# Patient Record
Sex: Female | Born: 1975 | Race: Black or African American | Hispanic: No | Marital: Married | Smoking: Never smoker
Health system: Southern US, Community
[De-identification: ages and names within clinical notes are randomized; demographics above are authoritative.]

## PROBLEM LIST (undated history)

## (undated) DIAGNOSIS — I10 Essential (primary) hypertension: Secondary | ICD-10-CM

## (undated) DIAGNOSIS — N920 Excessive and frequent menstruation with regular cycle: Secondary | ICD-10-CM

## (undated) DIAGNOSIS — R0602 Shortness of breath: Secondary | ICD-10-CM

## (undated) DIAGNOSIS — I499 Cardiac arrhythmia, unspecified: Secondary | ICD-10-CM

## (undated) DIAGNOSIS — D649 Anemia, unspecified: Secondary | ICD-10-CM

## (undated) DIAGNOSIS — R011 Cardiac murmur, unspecified: Secondary | ICD-10-CM

## (undated) DIAGNOSIS — D219 Benign neoplasm of connective and other soft tissue, unspecified: Secondary | ICD-10-CM

## (undated) HISTORY — DX: Anemia, unspecified: D64.9

## (undated) HISTORY — PX: ABDOMINAL HYSTERECTOMY: SHX81

## (undated) HISTORY — DX: Shortness of breath: R06.02

## (undated) HISTORY — DX: Cardiac murmur, unspecified: R01.1

## (undated) HISTORY — DX: Cardiac arrhythmia, unspecified: I49.9

## (undated) HISTORY — DX: Essential (primary) hypertension: I10

---

## 1999-02-10 ENCOUNTER — Inpatient Hospital Stay (HOSPITAL_COMMUNITY): Admission: AD | Admit: 1999-02-10 | Discharge: 1999-02-13 | Payer: Self-pay | Admitting: Obstetrics

## 2000-09-24 ENCOUNTER — Other Ambulatory Visit: Admission: RE | Admit: 2000-09-24 | Discharge: 2000-09-24 | Payer: Self-pay | Admitting: *Deleted

## 2000-10-10 ENCOUNTER — Inpatient Hospital Stay (HOSPITAL_COMMUNITY): Admission: AD | Admit: 2000-10-10 | Discharge: 2000-10-12 | Payer: Self-pay | Admitting: *Deleted

## 2000-12-22 ENCOUNTER — Other Ambulatory Visit: Admission: RE | Admit: 2000-12-22 | Discharge: 2000-12-22 | Payer: Self-pay | Admitting: *Deleted

## 2001-05-28 ENCOUNTER — Other Ambulatory Visit: Admission: RE | Admit: 2001-05-28 | Discharge: 2001-05-28 | Payer: Self-pay | Admitting: Obstetrics & Gynecology

## 2001-11-24 ENCOUNTER — Other Ambulatory Visit: Admission: RE | Admit: 2001-11-24 | Discharge: 2001-11-24 | Payer: Self-pay | Admitting: Obstetrics & Gynecology

## 2002-07-05 ENCOUNTER — Other Ambulatory Visit: Admission: RE | Admit: 2002-07-05 | Discharge: 2002-07-05 | Payer: Self-pay | Admitting: Obstetrics & Gynecology

## 2003-01-06 ENCOUNTER — Other Ambulatory Visit: Admission: RE | Admit: 2003-01-06 | Discharge: 2003-01-06 | Payer: Self-pay | Admitting: Obstetrics & Gynecology

## 2003-03-12 HISTORY — PX: TUBAL LIGATION: SHX77

## 2003-04-17 ENCOUNTER — Emergency Department (HOSPITAL_COMMUNITY): Admission: EM | Admit: 2003-04-17 | Discharge: 2003-04-18 | Payer: Self-pay | Admitting: Emergency Medicine

## 2003-07-01 ENCOUNTER — Ambulatory Visit (HOSPITAL_COMMUNITY): Admission: RE | Admit: 2003-07-01 | Discharge: 2003-07-01 | Payer: Self-pay | Admitting: Obstetrics & Gynecology

## 2010-05-08 ENCOUNTER — Encounter (INDEPENDENT_AMBULATORY_CARE_PROVIDER_SITE_OTHER): Payer: Self-pay | Admitting: *Deleted

## 2010-05-17 NOTE — Letter (Signed)
Summary: New Patient letter  [facility]  [address]   [place], [place] [address]   Phone: [phone]  Fax: [phone]       05/08/2010 MRN: [id number]  [facility] [address] [place]  [address]  Dear Robin Townsend,  Welcome to the Gastroenterology Division at [facility].    You are scheduled to see Dr.  Russella Dar on 06/25/2010 at 3:30 on the 3rd floor at [facility], [address]  We ask that you try to arrive at our office 15 minutes prior to your appointment time to allow for check-in.  We would like you to complete the enclosed self-administered evaluation form prior to your visit and bring it with you on the day of your appointment.  We will review it with you.  Also, please bring a complete list of all your medications or, if you prefer, bring the medication bottles and we will list them.  Please bring your insurance card so that we may make a copy of it.  If your insurance requires a referral to see a specialist, please bring your referral form from your primary care physician.  Co-payments are due at the time of your visit and may be paid by cash, check or credit card.     Your office visit will consist of a consult with your physician (includes a physical exam), any laboratory testing he/she may order, scheduling of any necessary diagnostic testing (e.g. x-ray, ultrasound, CT-scan), and scheduling of a procedure (e.g. Endoscopy, Colonoscopy) if required.  Please allow enough time on your schedule to allow for any/all of these possibilities.    If you cannot keep your appointment, please call [phone] to cancel or reschedule prior to your appointment date.  This allows [place] the opportunity to schedule an appointment for another patient in need of care.  If you do not cancel or reschedule by 5 p.m. the business day prior to your appointment date, you will be charged a $50.00 late cancellation/no-show fee.    Thank you for  choosing [facility] for your medical needs.  We appreciate the opportunity to care for you.  Please visit [place] at our website  to learn more about our practice.                     Sincerely,                                                             The Gastroenterology Division

## 2010-06-25 ENCOUNTER — Ambulatory Visit: Payer: Self-pay | Admitting: Gastroenterology

## 2010-08-08 ENCOUNTER — Ambulatory Visit (INDEPENDENT_AMBULATORY_CARE_PROVIDER_SITE_OTHER): Payer: Managed Care, Other (non HMO) | Admitting: Gastroenterology

## 2010-08-08 ENCOUNTER — Encounter: Payer: Self-pay | Admitting: Gastroenterology

## 2010-08-08 VITALS — BP 108/70 | HR 80 | Ht 64.5 in | Wt 182.6 lb

## 2010-08-08 DIAGNOSIS — R110 Nausea: Secondary | ICD-10-CM

## 2010-08-08 MED ORDER — OMEPRAZOLE 20 MG PO CPDR: 20.0000 mg | DELAYED_RELEASE_CAPSULE | Freq: Every day | ORAL | Status: DC

## 2010-08-08 NOTE — Progress Notes (Signed)
History of Present Illness: This is a  35 year old female who relates a several month history of postprandial nausea. She states with almost any meal she has 5-15 minutes and nausea following the meal and then her symptoms abate. She denies vomiting, heartburn, chest pain, abdominal pain, weight loss, melena, hematochezia, dysphagia. She relates a history of iron deficiency anemia felt secondary to menstrual losses followed by her gynecologist.  Review of Systems: Pertinent positive and negative review of systems were noted in the above HPI section. All other review of systems were otherwise negative.  Current Medications, Allergies, Past Medical History, Past Surgical History, Family History and Social History were reviewed in [employer] record.  Physical Exam: General: Well developed , well nourished, no acute distress Head: Normocephalic and atraumatic Eyes:  sclerae anicteric, EOMI Ears: Normal auditory acuity Mouth: No deformity or lesions Neck: Supple, no masses or thyromegaly Lungs: Clear throughout to auscultation Heart: Regular rate and rhythm; no murmurs, rubs or bruits Abdomen: Soft, non tender and non distended. No masses, hepatosplenomegaly or hernias noted. Normal Bowel sounds Musculoskeletal: Symmetrical with no gross deformities  Skin: No lesions on visible extremities Pulses:  Normal pulses noted Extremities: No clubbing, cyanosis, edema or deformities noted Neurological: Alert oriented x 4, grossly nonfocal Cervical Nodes:  No significant cervical adenopathy Inguinal Nodes: No significant inguinal adenopathy Psychological:  Alert and cooperative. Normal mood and affect  Assessment and Recommendations:  1. Nausea-probably secondary to GERD. Begin standard antireflux measures and omeprazole 20 mg every morning. Return office visit in 4 weeks. If her symptoms have not resolved consider further evaluation with upper endoscopy.

## 2010-08-08 NOTE — Patient Instructions (Signed)
Your prescription has been sent to your pharmacy.  Patient advised to avoid spicy, acidic, citrus, chocolate, mints, fruit and fruit juices.  Limit the intake of caffeine, alcohol and Soda.  Don't exercise too soon after eating.  Don't lie down within 3-4 hours of eating.  Elevate the head of your bed.

## 2010-09-05 ENCOUNTER — Ambulatory Visit: Payer: Managed Care, Other (non HMO) | Admitting: Gastroenterology

## 2011-04-16 ENCOUNTER — Ambulatory Visit (INDEPENDENT_AMBULATORY_CARE_PROVIDER_SITE_OTHER): Payer: Managed Care, Other (non HMO) | Admitting: Family Medicine

## 2011-04-16 ENCOUNTER — Ambulatory Visit: Payer: Managed Care, Other (non HMO) | Admitting: Physician Assistant

## 2011-04-16 VITALS — BP 123/79 | HR 88 | Temp 98.6°F | Resp 16 | Ht 65.5 in | Wt 187.0 lb

## 2011-04-16 DIAGNOSIS — M79672 Pain in left foot: Secondary | ICD-10-CM

## 2011-04-16 DIAGNOSIS — M79609 Pain in unspecified limb: Secondary | ICD-10-CM

## 2011-04-16 MED ORDER — TRAMADOL HCL 50 MG PO TABS: 50.0000 mg | ORAL_TABLET | Freq: Three times a day (TID) | ORAL | Status: AC | PRN

## 2011-04-16 NOTE — Progress Notes (Signed)
  Subjective:    Patient ID: Robin Townsend, female    DOB: 1975/05/05, 36 y.o.   MRN: [id number]  HPI Patient presents with a 3 week history of (R) heel pain after playing basketball. Pain is intermitant and worse upon awakening and after being on her feet for prolong period of time. Wears shoes with poor support.  Sunday, son accidentally fell on right foot. (L) 2nd-4th toes ?bent backward. Painful to bear weight.  (L) middle toe most painful.    Review of Systems  Musculoskeletal: Positive for joint swelling. Gait problem: painful to bear weight.       Objective:   Physical Exam  Constitutional: She appears well-developed and well-nourished.  Cardiovascular: Normal rate, regular rhythm and normal heart sounds.   Pulmonary/Chest: Effort normal and breath sounds normal.  Musculoskeletal:       Left foot: She exhibits tenderness (pain over left 2nd, 3rd, 4th distal phalanx, swelling), bony tenderness and swelling. She exhibits no deformity.       Feet:      [facility] reading (PRIMARY) by  Dr. Hal Hope No fracture or dislocation.     Assessment & Plan:  Traumatic injury to (L) 2nd-4th toes; ligamentous Plantar fasciitis-(L) heel   P/ Ultram 50mg   8hours prn      Post op shoe      Call or f/u in 10 days INB

## 2011-04-17 ENCOUNTER — Telehealth: Payer: Self-pay

## 2011-04-17 NOTE — Telephone Encounter (Signed)
Called pt back, who had LM on nurse VM, and explained Dr Richter's notes on xray report. Pt agreed.

## 2011-04-18 ENCOUNTER — Telehealth: Payer: Self-pay | Admitting: Family Medicine

## 2011-04-18 NOTE — Telephone Encounter (Signed)
LMOM FOR PT TO CALL BACK WITH STATUS.

## 2011-04-18 NOTE — Progress Notes (Signed)
  Subjective:    Patient ID: Robin Townsend, female    DOB: Aug 17, 1975, 36 y.o.   MRN: [id number]  HPI    Review of Systems     Objective:   Physical Exam        Assessment & Plan:

## 2011-04-21 NOTE — Telephone Encounter (Signed)
PT STATES SHE IS A LITTLE BETTER AND SHE IS STILL USING THE ICE PACKS BUT IS BETTER THAN BEFORE

## 2011-10-18 ENCOUNTER — Encounter: Payer: Self-pay | Admitting: *Deleted

## 2011-10-19 NOTE — Progress Notes (Signed)
This encounter was created in error - please disregard.

## 2011-11-15 NOTE — Progress Notes (Signed)
This encounter was created in error - please disregard.

## 2015-08-24 DIAGNOSIS — N760 Acute vaginitis: Secondary | ICD-10-CM | POA: Diagnosis not present

## 2015-08-24 DIAGNOSIS — Z113 Encounter for screening for infections with a predominantly sexual mode of transmission: Secondary | ICD-10-CM | POA: Diagnosis not present

## 2015-11-23 DIAGNOSIS — Z01419 Encounter for gynecological examination (general) (routine) without abnormal findings: Secondary | ICD-10-CM | POA: Diagnosis not present

## 2015-11-23 DIAGNOSIS — N920 Excessive and frequent menstruation with regular cycle: Secondary | ICD-10-CM | POA: Diagnosis not present

## 2015-11-23 DIAGNOSIS — Z6831 Body mass index (BMI) 31.0-31.9, adult: Secondary | ICD-10-CM | POA: Diagnosis not present

## 2015-11-23 DIAGNOSIS — D649 Anemia, unspecified: Secondary | ICD-10-CM | POA: Diagnosis not present

## 2016-02-06 ENCOUNTER — Emergency Department (HOSPITAL_BASED_OUTPATIENT_CLINIC_OR_DEPARTMENT_OTHER)
Admission: EM | Admit: 2016-02-06 | Discharge: 2016-02-06 | Disposition: A | Discharge status: Home / Self Care | Payer: BLUE CROSS/BLUE SHIELD | Attending: Emergency Medicine | Admitting: Emergency Medicine

## 2016-02-06 ENCOUNTER — Encounter (HOSPITAL_BASED_OUTPATIENT_CLINIC_OR_DEPARTMENT_OTHER): Payer: Self-pay

## 2016-02-06 ENCOUNTER — Emergency Department (HOSPITAL_BASED_OUTPATIENT_CLINIC_OR_DEPARTMENT_OTHER): Payer: BLUE CROSS/BLUE SHIELD

## 2016-02-06 DIAGNOSIS — M545 Low back pain, unspecified: Secondary | ICD-10-CM

## 2016-02-06 DIAGNOSIS — R202 Paresthesia of skin: Secondary | ICD-10-CM | POA: Diagnosis not present

## 2016-02-06 LAB — URINALYSIS, ROUTINE W REFLEX MICROSCOPIC
Bilirubin Urine: NEGATIVE
GLUCOSE, UA: NEGATIVE mg/dL
Hgb urine dipstick: NEGATIVE
KETONES UR: NEGATIVE mg/dL
LEUKOCYTES UA: NEGATIVE
NITRITE: NEGATIVE
PH: 5.5 (ref 5.0–8.0)
Protein, ur: NEGATIVE mg/dL
SPECIFIC GRAVITY, URINE: 1.024 (ref 1.005–1.030)

## 2016-02-06 LAB — PREGNANCY, URINE: Preg Test, Ur: NEGATIVE

## 2016-02-06 MED ORDER — CYCLOBENZAPRINE HCL 5 MG PO TABS
5.0000 mg | ORAL_TABLET | Freq: Once | ORAL | Status: DC
  Filled 2016-02-06: qty 1

## 2016-02-06 MED ORDER — ACETAMINOPHEN 325 MG PO TABS
650.0000 mg | ORAL_TABLET | Freq: Once | ORAL | Status: AC
  Administered 2016-02-06: 650 mg via ORAL
  Filled 2016-02-06: qty 2

## 2016-02-06 MED ORDER — CYCLOBENZAPRINE HCL 5 MG PO TABS: 5.0000 mg | ORAL_TABLET | Freq: Three times a day (TID) | ORAL | 0 refills | Status: DC | PRN

## 2016-02-06 NOTE — ED Triage Notes (Signed)
C/o lower back pain x 1.5 months-denies injury-NAD-steady gait

## 2016-02-06 NOTE — ED Provider Notes (Signed)
[facility] Provider Note   CSN: [id number] Arrival date & time: 02/06/16  1639  By signing my name below, I, [name], attest that this documentation has been prepared under the direction and in the presence of Gay Filler, PA-C Electronically Signed: [name], ED Scribe. 02/06/16. 6:14 PM.   History   Chief Complaint Chief Complaint  Patient presents with  . Back Pain    HPI Robin Townsend is a 40 y.o. female who presents to the Emergency Department complaining of constant, sharp lower back pain onset 1.5 months. Pt denies any recent injury, fall, or trauma to her lower back. Pt states that prior to the onset of her symptoms, she was ambulating a lot. She reports that the back pain does radiate to her left lower back and intermittently to her left leg. Pt states that her lower back pain is worsened with movement and ambulation. Pt denies alleviating factors for her lower back pain. She states that she is having associated symptoms of tingling to left leg. She states that she has tried ibuprofen with no relief for her symptoms. Pt denies fever, weight loss, night sweats, saddle paresthesia, bowel/bladder incontinence, gait problem, numbness, weakness, dysuria, hematuria, nausea, vomiting, blurred vision, double vision, and any other symptoms. Denies hx of CA, back pain, back injections, or IV drug use. Pt denies having a PCP at this time.     The history is provided by the patient. No language interpreter was used.    Past Medical History:  Diagnosis Date  . Anemia   . Irregular heart beat     There are no active problems to display for this patient.   Past Surgical History:  Procedure Laterality Date  . TUBAL LIGATION  2005    OB History    No data available       Home Medications    Prior to Admission medications   Medication Sig Start Date End Date Taking? Authorizing Provider  cyclobenzaprine (FLEXERIL) 5 MG tablet Take 1 tablet (5  mg total) by mouth 3 (three) times daily as needed for muscle spasms. 02/06/16   Roxanna Mew, PA-C    Family History Family History  Problem Relation Age of Onset  . Colon cancer Neg Hx     Social History Social History  Substance Use Topics  . Smoking status: Never Smoker  . Smokeless tobacco: Never Used  . Alcohol use Yes     Comment: occ     Allergies   Naproxen sodium   Review of Systems Review of Systems  Constitutional: Negative for chills, diaphoresis and fever.  Eyes: Negative for visual disturbance.  Gastrointestinal: Negative for abdominal pain, nausea and vomiting.       No bowel incontinence  Genitourinary: Negative for difficulty urinating, dysuria and hematuria.       No bladder incontinence  Musculoskeletal: Positive for back pain. Negative for gait problem.  Skin: Negative for rash.  Allergic/Immunologic: Negative for immunocompromised state.  Neurological: Negative for dizziness, weakness, light-headedness and numbness.       Tingling to left leg     Physical Exam Updated Vital Signs BP 130/95 (BP Location: Left Arm)   Pulse 97   Temp 98.9 F (37.2 C) (Oral)   Resp 18   Ht 5\' 4"  (1.626 m)   Wt 187 lb (84.8 kg)   LMP 01/15/2016   SpO2 100%   BMI 32.10 kg/m   Physical Exam  Constitutional: She appears well-developed and well-nourished. No distress.  HENT:  Head: Normocephalic and atraumatic.  Mouth/Throat: Oropharynx is clear and moist.  Eyes: Conjunctivae and EOM are normal. Pupils are equal, round, and reactive to light. Right eye exhibits no discharge. Left eye exhibits no discharge.  Neck: Normal range of motion. Neck supple.  Cardiovascular: Normal rate, regular rhythm, normal heart sounds and intact distal pulses.  Exam reveals no gallop and no friction rub.   No murmur heard. Pulmonary/Chest: Effort normal and breath sounds normal. No respiratory distress. She has no wheezes. She has no rales.  Abdominal: Soft. She exhibits  no distension and no pulsatile midline mass. There is no tenderness.  Musculoskeletal: Normal range of motion.       Lumbar back: She exhibits tenderness. She exhibits normal range of motion.  TTP along lumbar spine, no obvious deformity ors tep off. ROM intact. Pain reproducible with ROM. Negative SLR.   Lymphadenopathy:    She has no cervical adenopathy.  Neurological: She is alert. She has normal strength. She is not disoriented. No sensory deficit. She exhibits normal muscle tone. Coordination and gait normal. GCS eye subscore is 4. GCS verbal subscore is 5. GCS motor subscore is 6.  CN 2-12 grossly intact. Pt moves all extremities with ease. Strength and sensation intact. Pt ambulatory with steady gait.   Skin: Skin is warm and dry. She is not diaphoretic.  Psychiatric: She has a normal mood and affect. Her behavior is normal.  Nursing note and vitals reviewed.    ED Treatments / Results  DIAGNOSTIC STUDIES: Oxygen Saturation is 100% on RA, nl by my interpretation.    COORDINATION OF CARE: 6:12 PM Discussed treatment plan with pt at bedside which includes UA, lumbar spine xray and pt agreed to plan.   Labs (all labs ordered are listed, but only abnormal results are displayed) Labs Reviewed  URINALYSIS, [place] (NOT AT [facility])  PREGNANCY, URINE    Radiology Dg Lumbar Spine Complete  Result Date: 02/06/2016 CLINICAL DATA:  Low back pain x1 month.  No known injury. EXAM: LUMBAR SPINE - COMPLETE 4+ VIEW COMPARISON:  None. FINDINGS: The left twelfth rib is a short ribs for numbering purposes. Slight disc space narrowing is suggested at L4-5. No spondylolysis nor spondylolisthesis. No acute fracture nor bone destruction. Bilateral tubal ligation clips are seen. Sacroiliac joints an arcuate lines of the sacrum are intact. Moderate colonic stool burden is noted. IMPRESSION: Slight disc space narrowing at L4-5. Electronically Signed   By: Avya Flavell Royalty M.D.   On:  02/06/2016 18:53    Procedures Procedures (including critical care time)  Medications Ordered in ED Medications  acetaminophen (TYLENOL) tablet 650 mg (650 mg Oral Given 02/06/16 1919)     Initial Impression / Assessment and Plan / ED Course  I have reviewed the triage vital signs and the nursing notes.  Pertinent labs & imaging results that were available during my care of the patient were reviewed by me and considered in my medical decision making (see chart for details).  Clinical Course as of Feb 06 102  Tue Feb 06, 2016  1930 No obvious fracture or subluxation. Slight disc narrowing at L4-L5.   DG Lumbar Spine Complete [AM]    Clinical Course User Index [AM] Roxanna Mew, PA-C    Patient presents to ED with complaint of low back pain x 1.5 months, no known trauma, no dysuria, hematuria. Patient is afebrile and non-toxic appearing in NAD. VSS.  Mild TTP of lumbar spine without step off  or obvious deformity. ROM intact; however, pain reproducible with ROM. No neurological deficits.  Pt ambulatory. No loss of bowel or bladder control.  Low suspicion for cauda equina.  No fever, night sweats, weight loss, h/o cancer, IVDU.  U/A negative for infection. X-ray of lumbar spine shows no obvious fracture or dislocation, slight disc space narrowing at L4-L5. ?MSK give reproducibility of pain vs. ?disc space narrowing at L4-L5 as culprit of pain. Discussed results and plan with patient. RICE protocol and pain medicine indicated and discussed with patient. Ibuprofen and Rx flexeril. Follow up with spine specialists, contact information provided. Return precautions discussed. Pt voiced understanding and is agreeable with plan.     Final Clinical Impressions(s) / ED Diagnoses   Final diagnoses:  Acute left-sided low back pain without sciatica    New Prescriptions Discharge Medication List as of 02/06/2016  9:10 PM    START taking these medications   Details  cyclobenzaprine  (FLEXERIL) 5 MG tablet Take 1 tablet (5 mg total) by mouth 3 (three) times daily as needed for muscle spasms., Starting Tue 02/06/2016, Print       I personally performed the services described in this documentation, which was scribed in my presence. The recorded information has been reviewed and is accurate.     Roxanna Mew, PA-C 02/07/16 0103    Margette Fast, MD 02/07/16 1021

## 2016-02-06 NOTE — Discharge Instructions (Signed)
Read the information below.  Your urine did not show any signs of infection. You have slight disc space narrowing at the L4-L5. This may be contributing to your symptoms.  Please take 400mg  ibuprofen or 650mg  tylenol every 6hrs for the next 2-3 days. I have prescribed flexeril as a muscle relaxer, this can make you drowsy, do not drive after taking. You can apply heat or ice to affected area.  I have provided the contact information for the spine specialists. Please call to schedule a follow up appointment for further management of your back pain.  Use the prescribed medication as directed.  Please discuss all new medications with your pharmacist.   You may return to the Emergency Department at any time for worsening condition or any new symptoms that concern you. Return to ED if you develop fever, loss of bowel or bladder control, numbness or weakness into legs, or any other new/concerning symptoms.

## 2016-02-06 NOTE — ED Notes (Signed)
ED Provider at bedside. 

## 2016-02-06 NOTE — ED Notes (Signed)
Pt teaching provided on medications that may cause drowsiness. Pt instructed not to drive or operate heavy machinery while taking the prescribed medication. Pt verbalized understanding.   

## 2016-02-06 NOTE — ED Notes (Signed)
Patient transported to X-ray 

## 2016-06-12 ENCOUNTER — Encounter: Payer: Self-pay | Admitting: Nurse Practitioner

## 2016-06-12 ENCOUNTER — Ambulatory Visit (INDEPENDENT_AMBULATORY_CARE_PROVIDER_SITE_OTHER): Payer: BLUE CROSS/BLUE SHIELD | Admitting: Nurse Practitioner

## 2016-06-12 ENCOUNTER — Other Ambulatory Visit (INDEPENDENT_AMBULATORY_CARE_PROVIDER_SITE_OTHER): Payer: BLUE CROSS/BLUE SHIELD

## 2016-06-12 VITALS — BP 118/78 | HR 106 | Temp 98.0°F | Ht 64.5 in | Wt 184.0 lb

## 2016-06-12 DIAGNOSIS — R739 Hyperglycemia, unspecified: Secondary | ICD-10-CM

## 2016-06-12 DIAGNOSIS — Z13220 Encounter for screening for lipoid disorders: Secondary | ICD-10-CM

## 2016-06-12 DIAGNOSIS — D500 Iron deficiency anemia secondary to blood loss (chronic): Secondary | ICD-10-CM

## 2016-06-12 DIAGNOSIS — Z136 Encounter for screening for cardiovascular disorders: Secondary | ICD-10-CM

## 2016-06-12 DIAGNOSIS — Z0000 Encounter for general adult medical examination without abnormal findings: Secondary | ICD-10-CM

## 2016-06-12 DIAGNOSIS — D649 Anemia, unspecified: Secondary | ICD-10-CM | POA: Insufficient documentation

## 2016-06-12 LAB — LIPID PANEL
CHOLESTEROL: 218 mg/dL — AB (ref 0–200)
HDL: 55.5 mg/dL (ref >39.00)
LDL CALC: 151 mg/dL — AB (ref 0–99)
NonHDL: 162.13
Total CHOL/HDL Ratio: 4
Triglycerides: 56 mg/dL (ref 0.0–149.0)
VLDL: 11.2 mg/dL (ref 0.0–40.0)

## 2016-06-12 LAB — TSH: TSH: 1.81 u[IU]/mL (ref 0.35–4.50)

## 2016-06-12 LAB — COMPREHENSIVE METABOLIC PANEL
ALK PHOS: 56 U/L (ref 39–117)
ALT: 26 U/L (ref 0–35)
AST: 25 U/L (ref 0–37)
Albumin: 4.1 g/dL (ref 3.5–5.2)
BILIRUBIN TOTAL: 0.3 mg/dL (ref 0.2–1.2)
BUN: 8 mg/dL (ref 6–23)
CALCIUM: 9.3 mg/dL (ref 8.4–10.5)
CO2: 24 mEq/L (ref 19–32)
Chloride: 105 mEq/L (ref 96–112)
Creatinine, Ser: 0.72 mg/dL (ref 0.40–1.20)
GFR: 115.61 mL/min (ref >60.00)
GLUCOSE: 89 mg/dL (ref 70–99)
POTASSIUM: 3.8 meq/L (ref 3.5–5.1)
Sodium: 137 mEq/L (ref 135–145)
TOTAL PROTEIN: 7.8 g/dL (ref 6.0–8.3)

## 2016-06-12 LAB — CBC WITH DIFFERENTIAL/PLATELET
Basophils Absolute: 0.1 10*3/uL (ref 0.0–0.1)
Basophils Relative: 0.9 % (ref 0.0–3.0)
EOS PCT: 0.6 % (ref 0.0–5.0)
Eosinophils Absolute: 0 10*3/uL (ref 0.0–0.7)
HCT: 35.3 % — ABNORMAL LOW (ref 36.0–46.0)
HEMOGLOBIN: 11.5 g/dL — AB (ref 12.0–15.0)
Lymphocytes Relative: 29.4 % (ref 12.0–46.0)
Lymphs Abs: 1.8 10*3/uL (ref 0.7–4.0)
MCHC: 32.6 g/dL (ref 30.0–36.0)
MCV: 81.7 fl (ref 78.0–100.0)
MONO ABS: 0.4 10*3/uL (ref 0.1–1.0)
Monocytes Relative: 6.1 % (ref 3.0–12.0)
Neutro Abs: 3.9 10*3/uL (ref 1.4–7.7)
Neutrophils Relative %: 63 % (ref 43.0–77.0)
Platelets: 278 10*3/uL (ref 150.0–400.0)
RBC: 4.33 Mil/uL (ref 3.87–5.11)
RDW: 14.9 % (ref 11.5–15.5)
WBC: 6.1 10*3/uL (ref 4.0–10.5)

## 2016-06-12 LAB — IRON AND TIBC
%SAT: 18 % (ref 11–50)
IRON: 65 ug/dL (ref 40–190)
TIBC: 358 ug/dL (ref 250–450)
UIBC: 293 ug/dL (ref 125–400)

## 2016-06-12 LAB — HEMOGLOBIN A1C: Hgb A1c MFr Bld: 5.8 % (ref 4.6–6.5)

## 2016-06-12 LAB — FERRITIN: Ferritin: 7.4 ng/mL — ABNORMAL LOW (ref 10.0–291.0)

## 2016-06-12 MED ORDER — FERROUS SULFATE 325 (65 FE) MG PO TABS: 325.0000 mg | ORAL_TABLET | Freq: Every day | ORAL | 0 refills | Status: DC

## 2016-06-12 MED ORDER — FERROUS SULFATE 325 (65 FE) MG PO TABS: 325.0000 mg | ORAL_TABLET | Freq: Every day | ORAL | 3 refills | Status: DC

## 2016-06-12 MED ORDER — MULTIVITAMIN ADULTS PO TABS
1.0000 | ORAL_TABLET | Freq: Every day | ORAL | 0 refills | Status: DC
Start: 2016-06-12 — End: 2021-05-31

## 2016-06-12 NOTE — Progress Notes (Signed)
Subjective:    Patient ID: Robin Townsend, female    DOB: [birth year], 41 y.o.   MRN: [id number]  Patient presents today for complete physical (new patient)  Anemia  Presents for follow-up visit. There has been no abdominal pain, anorexia, bruising/bleeding easily, confusion, fever, leg swelling, light-headedness, malaise/fatigue, pallor, palpitations, paresthesias, pica or weight loss. Signs of blood loss that are not present include hematemesis, hematochezia, melena and menorrhagia. There are no compliance problems.     last CPE done 01/2016 by GYN.  Immunizations: (TDAP, Hep C screen, Pneumovax, Influenza, zoster)  Health Maintenance  Topic Date Due  . Pap Smear  11/05/1997  . Tetanus Vaccine  06/09/2017*  . HIV Screening  06/10/2017*  . Flu Shot  10/09/2016  *Topic was postponed. The date shown is not the original due date.   Weight:  Wt Readings from Last 3 Encounters:  06/12/16 184 lb (83.5 kg)  02/06/16 187 lb (84.8 kg)  04/16/11 187 lb (84.8 kg)   Fall Risk: Fall Risk  06/12/2016  Falls in the past year? No   Home Safety:home with husband and children. Works at [employer].  Depression/Suicide: Depression screen [facility] 2/9 06/12/2016  Decreased Interest 0  Down, Depressed, Hopeless 0  PHQ - 2 Score 0   Pap Smear (every 85yrs for >21-29 without HPV, every 11yrs for >30-64yrs with HPV):last done 01/2016 (abnormal per patient), done by Mickeal Skinner, NP-C [facility]. Will schedule repeat PAP.  Mammogram (yearly, >68yrs):not done  Vision:up to date  Dental:up to date Advanced Directive: Advanced Directives 02/06/2016  Does Patient Have a Medical Advance Directive? No   Sexual History (birth control, marital status, STD):married, sexually active, tubal ligation  Medications and allergies reviewed with patient and updated if appropriate.  Patient Active Problem List   Diagnosis Date Noted  . Anemia 06/12/2016  . Hyperglycemia 06/12/2016    No  current outpatient prescriptions on file prior to visit.   No current facility-administered medications on file prior to visit.     Past Medical History:  Diagnosis Date  . Anemia   . Irregular heart beat     Past Surgical History:  Procedure Laterality Date  . TUBAL LIGATION  2005    Social History   Social History  . Marital status: Married    Spouse name: N/A  . Number of children: 2  . Years of education: N/A   Occupational History  . mortgage specialist    Social History Main Topics  . Smoking status: Never Smoker  . Smokeless tobacco: Never Used  . Alcohol use Yes     Comment: occ  . Drug use: No  . Sexual activity: Not Asked   Other Topics Concern  . None   Social History Narrative  . None    Family History  Problem Relation Age of Onset  . Hyperlipidemia Mother   . Hypertension Mother   . Cancer Father     lung cancer  . Anemia Sister   . Hyperlipidemia Sister   . Hypertension Sister   . Colon cancer Neg Hx         Review of Systems  Constitutional: Negative for fever, malaise/fatigue and weight loss.  HENT: Negative for congestion and sore throat.   Eyes:       Negative for visual changes  Respiratory: Negative for cough and shortness of breath.   Cardiovascular: Negative for chest pain, palpitations and leg swelling.  Gastrointestinal: Negative for abdominal pain, anorexia, blood in stool,  constipation, diarrhea, heartburn, hematemesis, hematochezia and melena.  Genitourinary: Negative for dysuria, frequency, menorrhagia and urgency.  Musculoskeletal: Negative for falls, joint pain and myalgias.  Skin: Negative for pallor and rash.  Neurological: Negative for dizziness, sensory change, light-headedness, headaches and paresthesias.  Endo/Heme/Allergies: Does not bruise/bleed easily.  Psychiatric/Behavioral: Negative for confusion, depression, substance abuse and suicidal ideas. The patient is not nervous/anxious.     Objective:    Vitals:   06/12/16 0911  BP: 118/78  Pulse: (!) 106  Temp: 98 F (36.7 C)    Body mass index is 31.1 kg/m.   Physical Examination:  Physical Exam  Constitutional: She is oriented to person, place, and time and well-developed, well-nourished, and in no distress. No distress.  HENT:  Right Ear: External ear normal.  Left Ear: External ear normal.  Nose: Nose normal.  Mouth/Throat: Oropharynx is clear and moist. No oropharyngeal exudate.  Eyes: Conjunctivae and EOM are normal. Pupils are equal, round, and reactive to light. No scleral icterus.  Neck: Normal range of motion. Neck supple. No thyromegaly present.  Cardiovascular: Normal rate, normal heart sounds and intact distal pulses.   Pulmonary/Chest: Effort normal and breath sounds normal. She exhibits no tenderness.  Abdominal: Soft. Bowel sounds are normal. She exhibits no distension. There is no tenderness.  Musculoskeletal: Normal range of motion. She exhibits no edema or tenderness.  Lymphadenopathy:    She has no cervical adenopathy.  Neurological: She is alert and oriented to person, place, and time. Gait normal.  Skin: Skin is warm and dry.  Psychiatric: Affect and judgment normal.    ASSESSMENT and PLAN:  Quianna was seen today for establish care.  Diagnoses and all orders for this visit:  Encounter for medical examination to establish care -     CBC w/Diff; Future -     Lipid panel; Future -     TSH; Future -     Comprehensive metabolic panel; Future -     Hemoglobin A1c; Future  Encounter for lipid screening for cardiovascular disease -     Lipid panel; Future  Iron deficiency anemia due to chronic blood loss -     CBC w/Diff; Future -     Discontinue: ferrous sulfate 325 (65 FE) MG tablet; Take 1 tablet (325 mg total) by mouth daily with breakfast. -     Multiple Vitamins-Minerals (MULTIVITAMIN ADULTS) TABS; Take 1 tablet by mouth daily. -     Iron Binding Cap (TIBC); Future -     Ferritin;  Future -     ferrous sulfate 325 (65 FE) MG tablet; Take 1 tablet (325 mg total) by mouth daily with breakfast.  Hyperglycemia -     Hemoglobin A1c; Future   No problem-specific Assessment & Plan notes found for this encounter.     Follow up: Return if symptoms worsen or fail to improve.  [name], NP

## 2016-06-12 NOTE — Progress Notes (Signed)
Pre visit review using our clinic review tool, if applicable. No additional management support is needed unless otherwise documented below in the visit note. 

## 2016-06-12 NOTE — Patient Instructions (Addendum)
Need labs result from GYN ([facility]    Iron Deficiency Anemia, Adult Iron deficiency anemia is a condition in which the concentration of red blood cells or hemoglobin in the blood is below normal because of too little iron. Hemoglobin is a substance in red blood cells that carries oxygen to the body's tissues. When the concentration of red blood cells or hemoglobin is too low, not enough oxygen reaches these tissues. Iron deficiency anemia is usually long-lasting (chronic) and it develops over time. It may or may not cause symptoms. It is a common type of anemia. What are the causes? This condition may be caused by:  Not enough iron in the diet.  Blood loss caused by bleeding in the intestine.  Blood loss from a gastrointestinal condition like Crohn disease.  Frequent blood draws, such as from blood donation.  Abnormal absorption in the gut.  Heavy menstrual periods in women.  Cancers of the gastrointestinal system, such as colon cancer. What are the signs or symptoms? Symptoms of this condition may include:  Fatigue.  Headache.  Pale skin, lips, and nail beds.  Poor appetite.  Weakness.  Shortness of breath.  Dizziness.  Cold hands and feet.  Fast or irregular heartbeat.  Irritability. This is more common in severe anemia.  Rapid breathing. This is more common in severe anemia. Mild anemia may not cause any symptoms. How is this diagnosed? This condition is diagnosed based on:  Your medical history.  A physical exam.  Blood tests. You may have additional tests to find the underlying cause of your anemia, such as:  Testing for blood in the stool (fecal occult blood test).  A procedure to see inside your colon and rectum (colonoscopy).  A procedure to see inside your esophagus and stomach (endoscopy).  A test in which cells are removed from bone marrow (bone marrow aspiration) or fluid is removed from the bone marrow to be examined  (biopsy). This is rarely needed. How is this treated? This condition is treated by correcting the cause of your iron deficiency. Treatment may involve:  Adding iron-rich foods to your diet.  Taking iron supplements. If you are pregnant or breastfeeding, you may need to take extra iron because your normal diet usually does not provide the amount of iron that you need.  Increasing vitamin C intake. Vitamin C helps your body absorb iron. Your health care provider may recommend that you take iron supplements along with a glass of orange juice or a vitamin C supplement.  Medicines to make heavy menstrual flow lighter.  Surgery. You may need repeat blood tests to determine whether treatment is working. Depending on the underlying cause, the anemia should be corrected within 2 months of starting treatment. If the treatment does not seem to be working, you may need more testing. Follow these instructions at home: Medicines   Take over-the-counter and prescription medicines only as told by your health care provider. This includes iron supplements and vitamins.  If you cannot tolerate taking iron supplements by mouth, talk with your health care provider about taking them through a vein (intravenously) or an injection into a muscle.  For the best iron absorption, you should take iron supplements when your stomach is empty. If you cannot tolerate them on an empty stomach, you may need to take them with food.  Do not drink milk or take antacids at the same time as your iron supplements. Milk and antacids may interfere with iron absorption.  Iron supplements can  cause constipation. To prevent constipation, include fiber in your diet as told by your health care provider. A stool softener may also be recommended. Eating and drinking   Talk with your health care provider before changing your diet. He or she may recommend that you eat foods that contain a lot of iron, such as:  Liver.  Low-fat (lean)  beef.  Breads and cereals that have iron added to them (are fortified).  Eggs.  Dried fruit.  Dark green, leafy vegetables.  To help your body use the iron from iron-rich foods, eat those foods at the same time as fresh fruits and vegetables that are high in vitamin C. Foods that are high in vitamin C include:  Oranges.  Peppers.  Tomatoes.  Mangoes.  Drinkenoughfluid to keep your urine clear or pale yellow. General instructions   Return to your normal activities as told by your health care provider. Ask your health care provider what activities are safe for you.  Practice good hygiene. Anemia can make you more prone to illness and infection.  Keep all follow-up visits as told by your health care provider. This is important. Contact a health care provider if:  You feel nauseous or you vomit.  You feel weak.  You have unexplained sweating.  You develop symptoms of constipation, such as:  Having fewer than three bowel movements a week.  Straining to have a bowel movement.  Having stools that are hard, dry, or larger than normal.  Feeling full or bloated.  Pain in the lower abdomen.  Not feeling relief after having a bowel movement. Get help right away if:  You faint. If this happens, do not drive yourself to the hospital. Call your local emergency services (911 in the [place]  You have chest pain.  You have shortness of breath that:  Is severe.  Gets worse with physical activity.  You have a rapid heartbeat.  You become light-headed when getting up from a sitting or lying down position. This information is not intended to replace advice given to you by your health care provider. Make sure you discuss any questions you have with your health care provider. Document Released: 02/23/2000 Document Revised: 11/15/2015 Document Reviewed: 11/15/2015 Elsevier Interactive Patient Education  2017 [employer].

## 2016-06-14 ENCOUNTER — Telehealth: Payer: Self-pay | Admitting: Nurse Practitioner

## 2016-06-14 NOTE — Telephone Encounter (Signed)
Pt called back returning your call. I gave her the notes that [facility] left on her lab order. She did not have any questions at this time.

## 2016-08-07 DIAGNOSIS — N760 Acute vaginitis: Secondary | ICD-10-CM | POA: Diagnosis not present

## 2016-08-07 DIAGNOSIS — R87610 Atypical squamous cells of undetermined significance on cytologic smear of cervix (ASC-US): Secondary | ICD-10-CM | POA: Diagnosis not present

## 2016-08-07 DIAGNOSIS — N920 Excessive and frequent menstruation with regular cycle: Secondary | ICD-10-CM | POA: Diagnosis not present

## 2016-10-20 ENCOUNTER — Other Ambulatory Visit: Payer: Self-pay | Admitting: Nurse Practitioner

## 2016-10-20 DIAGNOSIS — D500 Iron deficiency anemia secondary to blood loss (chronic): Secondary | ICD-10-CM

## 2017-01-16 DIAGNOSIS — Z113 Encounter for screening for infections with a predominantly sexual mode of transmission: Secondary | ICD-10-CM | POA: Diagnosis not present

## 2018-07-08 ENCOUNTER — Telehealth: Payer: Self-pay | Admitting: Nurse Practitioner

## 2018-07-08 NOTE — Telephone Encounter (Signed)
I called and left message on patient voicemail per [name] to call office and schedule follow up appointment.

## 2018-11-11 NOTE — Patient Instructions (Addendum)
YOU ARE SCHEDULED FOR A COVID TEST ____Friday 9-4-2020_____@_____8 :05am______. THIS TEST MUST BE DONE BEFORE SURGERY. GO TO  [address], [place], [address] AND REMAIN IN YOUR CAR, THIS IS A DRIVE UP TEST. ONCE YOUR COVID TEST IS DONE PLEASE FOLLOW ALL THE QUARANTINE  INSTRUCTIONS GIVEN IN YOUR HANDOUT.      Your procedure is scheduled on 11-17-2018    Report to [facility] AT  5:30A.M.   Call this number if you have problems the morning of surgery  :[phone].   OUR ADDRESS IS [address].  WE ARE LOCATED IN [facility].                                     REMEMBER:  DO NOT EAT FOOD OR DRINK LIQUIDS AFTER MIDNIGHT .   TAKE THESE MEDICATIONS MORNING OF SURGERY WITH A SIP OF WATER:  ______NONE____________  YOU ARE SPENDING THE NIGHT AFTER SURGERY . YOU WILL BE SPEND ONE NIGHT AT [facility]. 1 VISITOR IS ALLOWED IN WAITING ROOM ONLY DAY OF SURGERY. NO VISITOR MAY SPEND THE NIGHT.                                     DO NOT WEAR JEWERLY, MAKE UP, OR NAIL POLISH,  DO NOT WEAR LOTIONS, POWDERS, PERFUMES OR DEODORANT. DO NOT SHAVE FOR 24 HOURS PRIOR TO DAY OF SURGERY. MEN MAY SHAVE FACE AND NECK. CONTACTS, GLASSES, OR DENTURES MAY NOT BE WORN TO SURGERY.                                    [facility] IS NOT RESPONSIBLE  FOR ANY BELONGINGS.                                                                    Marland Kitchen                                                                                                    [facility] - Preparing for Surgery Before surgery, you can play an important role.  Because skin is not sterile, your skin needs to be as free of germs as possible.  You can reduce the number of germs on your skin by washing with CHG (chlorahexidine gluconate) soap before surgery.  CHG is an antiseptic cleaner which kills germs and bonds with the skin to continue killing germs even after washing. Please DO NOT use if you have  an allergy to CHG or antibacterial soaps.  If your skin becomes reddened/irritated stop using the CHG and  inform your nurse when you arrive at Short Stay. Do not shave (including legs and underarms) for at least 48 hours prior to the first CHG shower.  You may shave your face/neck. Please follow these instructions carefully:  1.  Shower with CHG Soap the night before surgery and the  morning of Surgery.  2.  If you choose to wash your hair, wash your hair first as usual with your  normal  shampoo.  3.  After you shampoo, rinse your hair and body thoroughly to remove the  shampoo.                           4.  Use CHG as you would any other liquid soap.  You can apply chg directly  to the skin and wash                       Gently with a scrungie or clean washcloth.  5.  Apply the CHG Soap to your body ONLY FROM THE NECK DOWN.   Do not use on face/ open                           Wound or open sores. Avoid contact with eyes, ears mouth and genitals (private parts).                       Wash face,  Genitals (private parts) with your normal soap.             6.  Wash thoroughly, paying special attention to the area where your surgery  will be performed.  7.  Thoroughly rinse your body with warm water from the neck down.  8.  DO NOT shower/wash with your normal soap after using and rinsing off  the CHG Soap.                9.  Pat yourself dry with a clean towel.            10.  Wear clean pajamas.            11.  Place clean sheets on your bed the night of your first shower and do not  sleep with pets. Day of Surgery : Do not apply any lotions/deodorants the morning of surgery.  Please wear clean clothes to the hospital/surgery center.  FAILURE TO FOLLOW THESE INSTRUCTIONS MAY RESULT IN THE CANCELLATION OF YOUR SURGERY PATIENT SIGNATURE_________________________________  NURSE SIGNATURE__________________________________  ________________________________________________________________________

## 2018-11-12 ENCOUNTER — Encounter (HOSPITAL_COMMUNITY)
Admission: RE | Admit: 2018-11-12 | Discharge: 2018-11-12 | Disposition: A | Discharge status: Home / Self Care | Payer: BC Managed Care – PPO | Source: Ambulatory Visit | Attending: Obstetrics and Gynecology | Admitting: Obstetrics and Gynecology

## 2018-11-12 ENCOUNTER — Other Ambulatory Visit: Payer: Self-pay

## 2018-11-12 ENCOUNTER — Encounter (HOSPITAL_COMMUNITY): Payer: Self-pay

## 2018-11-12 DIAGNOSIS — Z01812 Encounter for preprocedural laboratory examination: Secondary | ICD-10-CM | POA: Diagnosis present

## 2018-11-12 HISTORY — DX: Excessive and frequent menstruation with regular cycle: N92.0

## 2018-11-12 HISTORY — DX: Benign neoplasm of connective and other soft tissue, unspecified: D21.9

## 2018-11-12 LAB — CBC
HCT: 34.7 % — ABNORMAL LOW (ref 36.0–46.0)
Hemoglobin: 10 g/dL — ABNORMAL LOW (ref 12.0–15.0)
MCH: 23 pg — ABNORMAL LOW (ref 26.0–34.0)
MCHC: 28.8 g/dL — ABNORMAL LOW (ref 30.0–36.0)
MCV: 79.8 fL — ABNORMAL LOW (ref 80.0–100.0)
Platelets: 298 10*3/uL (ref 150–400)
RBC: 4.35 MIL/uL (ref 3.87–5.11)
RDW: 18.9 % — ABNORMAL HIGH (ref 11.5–15.5)
WBC: 5.3 10*3/uL (ref 4.0–10.5)
nRBC: 0 % (ref 0.0–0.2)

## 2018-11-13 ENCOUNTER — Other Ambulatory Visit (HOSPITAL_COMMUNITY)
Admission: RE | Admit: 2018-11-13 | Discharge: 2018-11-13 | Disposition: A | Discharge status: Home / Self Care | Payer: BC Managed Care – PPO | Source: Ambulatory Visit | Attending: Obstetrics and Gynecology | Admitting: Obstetrics and Gynecology

## 2018-11-13 ENCOUNTER — Other Ambulatory Visit (HOSPITAL_COMMUNITY): Payer: BC Managed Care – PPO

## 2018-11-13 DIAGNOSIS — Z01812 Encounter for preprocedural laboratory examination: Secondary | ICD-10-CM | POA: Insufficient documentation

## 2018-11-13 DIAGNOSIS — Z20828 Contact with and (suspected) exposure to other viral communicable diseases: Secondary | ICD-10-CM | POA: Diagnosis not present

## 2018-11-13 LAB — ABO/RH: ABO/RH(D): O POS

## 2018-11-15 LAB — NOVEL CORONAVIRUS, NAA (HOSP ORDER, SEND-OUT TO REF LAB; TAT 18-24 HRS): SARS-CoV-2, NAA: NOT DETECTED

## 2018-11-16 NOTE — H&P (Addendum)
Robin Townsend is an 43 y.o. female presents for surgical mngt of menorrhagia and fibroids.     Menstrual History: Patient's last menstrual period was 10/26/2018.    Past Medical History:  Diagnosis Date  . Anemia   . Fibroid   . Irregular heart beat    dx as youth , told her she had a heart murmur but reports non symptomatic   . Menorrhagia     Past Surgical History:  Procedure Laterality Date  . TUBAL LIGATION  2005    Family History  Problem Relation Age of Onset  . Hyperlipidemia Mother   . Hypertension Mother   . Cancer Father        lung cancer  . Anemia Sister   . Hyperlipidemia Sister   . Hypertension Sister   . Colon cancer Neg Hx     Social History:  reports that she has never smoked. She has never used smokeless tobacco. She reports current alcohol use. She reports that she does not use drugs.  Allergies:  Allergies  Allergen Reactions  . Naproxen Sodium Swelling    No medications prior to admission.    ROS  Last menstrual period 10/26/2018. Physical Exam  Gen - NAD Abd - soft, NT/ND Ext - NT, no edema PV - uterus mobile NT, no adnexal mass  PV [place]:  4.5 cm partially intramural fibroid noted.  No adnexal mass or free fluid   Assessment/Plan:  Menorrhagia/fibroid LAVH w/ possible abdominal hysterectomy R/b/a discussed with pt, questions answered, informed consent Marylynn Pearson 11/16/2018, 1:09 PM

## 2018-11-17 ENCOUNTER — Observation Stay (HOSPITAL_BASED_OUTPATIENT_CLINIC_OR_DEPARTMENT_OTHER)
Admission: RE | Admit: 2018-11-17 | Discharge: 2018-11-18 | Disposition: A | Discharge status: Home / Self Care | Payer: BC Managed Care – PPO | Attending: Obstetrics and Gynecology | Admitting: Obstetrics and Gynecology

## 2018-11-17 ENCOUNTER — Encounter (HOSPITAL_BASED_OUTPATIENT_CLINIC_OR_DEPARTMENT_OTHER): Payer: Self-pay | Admitting: *Deleted

## 2018-11-17 ENCOUNTER — Ambulatory Visit (HOSPITAL_BASED_OUTPATIENT_CLINIC_OR_DEPARTMENT_OTHER): Payer: BC Managed Care – PPO | Admitting: Physician Assistant

## 2018-11-17 ENCOUNTER — Encounter (HOSPITAL_BASED_OUTPATIENT_CLINIC_OR_DEPARTMENT_OTHER)
Admission: RE | Disposition: A | Discharge status: Home / Self Care | Payer: Self-pay | Source: Home / Self Care | Attending: Obstetrics and Gynecology

## 2018-11-17 ENCOUNTER — Ambulatory Visit (HOSPITAL_BASED_OUTPATIENT_CLINIC_OR_DEPARTMENT_OTHER): Payer: BC Managed Care – PPO | Admitting: Anesthesiology

## 2018-11-17 ENCOUNTER — Other Ambulatory Visit: Payer: Self-pay

## 2018-11-17 DIAGNOSIS — E669 Obesity, unspecified: Secondary | ICD-10-CM | POA: Diagnosis not present

## 2018-11-17 DIAGNOSIS — Z6832 Body mass index (BMI) 32.0-32.9, adult: Secondary | ICD-10-CM | POA: Diagnosis not present

## 2018-11-17 DIAGNOSIS — Z5331 Laparoscopic surgical procedure converted to open procedure: Secondary | ICD-10-CM | POA: Insufficient documentation

## 2018-11-17 DIAGNOSIS — D219 Benign neoplasm of connective and other soft tissue, unspecified: Secondary | ICD-10-CM | POA: Diagnosis present

## 2018-11-17 DIAGNOSIS — N800 Endometriosis of uterus: Secondary | ICD-10-CM | POA: Insufficient documentation

## 2018-11-17 DIAGNOSIS — D649 Anemia, unspecified: Secondary | ICD-10-CM | POA: Insufficient documentation

## 2018-11-17 DIAGNOSIS — D259 Leiomyoma of uterus, unspecified: Principal | ICD-10-CM | POA: Insufficient documentation

## 2018-11-17 DIAGNOSIS — N736 Female pelvic peritoneal adhesions (postinfective): Secondary | ICD-10-CM | POA: Diagnosis not present

## 2018-11-17 DIAGNOSIS — N920 Excessive and frequent menstruation with regular cycle: Secondary | ICD-10-CM | POA: Diagnosis not present

## 2018-11-17 HISTORY — PX: LAPAROSCOPIC VAGINAL HYSTERECTOMY WITH SALPINGECTOMY: SHX6680

## 2018-11-17 LAB — TYPE AND SCREEN
ABO/RH(D): O POS
Antibody Screen: NEGATIVE

## 2018-11-17 LAB — POCT PREGNANCY, URINE: Preg Test, Ur: NEGATIVE

## 2018-11-17 SURGERY — HYSTERECTOMY, VAGINAL, LAPAROSCOPY-ASSISTED, WITH SALPINGECTOMY
Anesthesia: General | Site: Abdomen | Laterality: Bilateral

## 2018-11-17 MED ORDER — LACTATED RINGERS IV SOLN
INTRAVENOUS | Status: DC
  Filled 2018-11-17: qty 1000

## 2018-11-17 MED ORDER — MENTHOL 3 MG MT LOZG
1.0000 | LOZENGE | OROMUCOSAL | Status: DC | PRN
  Filled 2018-11-17: qty 9

## 2018-11-17 MED ORDER — ACETAMINOPHEN 10 MG/ML IV SOLN
1000.0000 mg | Freq: Once | INTRAVENOUS | Status: AC
  Administered 2018-11-17: 1000 mg via INTRAVENOUS
  Filled 2018-11-17: qty 100

## 2018-11-17 MED ORDER — KETOROLAC TROMETHAMINE 30 MG/ML IJ SOLN
INTRAMUSCULAR | Status: AC
  Filled 2018-11-17: qty 1

## 2018-11-17 MED ORDER — ACETAMINOPHEN 325 MG PO TABS
650.0000 mg | ORAL_TABLET | ORAL | Status: DC | PRN
  Filled 2018-11-17: qty 2

## 2018-11-17 MED ORDER — DEXAMETHASONE SODIUM PHOSPHATE 10 MG/ML IJ SOLN
INTRAMUSCULAR | Status: DC | PRN
  Administered 2018-11-17: 10 mg via INTRAVENOUS

## 2018-11-17 MED ORDER — FENTANYL CITRATE (PF) 100 MCG/2ML IJ SOLN
INTRAMUSCULAR | Status: AC
  Filled 2018-11-17: qty 2

## 2018-11-17 MED ORDER — SODIUM CHLORIDE 0.9 % IV SOLN
2.0000 g | INTRAVENOUS | Status: AC
  Administered 2018-11-17: 2 g via INTRAVENOUS
  Filled 2018-11-17: qty 2

## 2018-11-17 MED ORDER — SIMETHICONE 80 MG PO CHEW
80.0000 mg | CHEWABLE_TABLET | Freq: Four times a day (QID) | ORAL | Status: DC | PRN
  Filled 2018-11-17: qty 1

## 2018-11-17 MED ORDER — HYDROMORPHONE HCL 1 MG/ML IJ SOLN
0.5000 mg | INTRAMUSCULAR | Status: DC | PRN
  Administered 2018-11-17: 0.5 mg via INTRAVENOUS
  Filled 2018-11-17: qty 0.5

## 2018-11-17 MED ORDER — PANTOPRAZOLE SODIUM 40 MG IV SOLR
INTRAVENOUS | Status: AC
  Filled 2018-11-17: qty 40

## 2018-11-17 MED ORDER — OXYCODONE HCL 5 MG/5ML PO SOLN
5.0000 mg | Freq: Once | ORAL | Status: DC | PRN
  Filled 2018-11-17: qty 5

## 2018-11-17 MED ORDER — FENTANYL CITRATE (PF) 100 MCG/2ML IJ SOLN
25.0000 ug | INTRAMUSCULAR | Status: DC | PRN
  Administered 2018-11-17 (×3): 50 ug via INTRAVENOUS
  Filled 2018-11-17: qty 1

## 2018-11-17 MED ORDER — BUPIVACAINE HCL (PF) 0.25 % IJ SOLN
INTRAMUSCULAR | Status: DC | PRN
  Administered 2018-11-17: 3 mL

## 2018-11-17 MED ORDER — MIDAZOLAM HCL 5 MG/5ML IJ SOLN
INTRAMUSCULAR | Status: DC | PRN
  Administered 2018-11-17: 2 mg via INTRAVENOUS

## 2018-11-17 MED ORDER — ONDANSETRON HCL 4 MG/2ML IJ SOLN
4.0000 mg | Freq: Four times a day (QID) | INTRAMUSCULAR | Status: DC | PRN
  Filled 2018-11-17: qty 2

## 2018-11-17 MED ORDER — PROPOFOL 10 MG/ML IV BOLUS
INTRAVENOUS | Status: AC
  Filled 2018-11-17: qty 20

## 2018-11-17 MED ORDER — LIDOCAINE 2% (20 MG/ML) 5 ML SYRINGE
INTRAMUSCULAR | Status: DC | PRN
  Administered 2018-11-17: 100 mg via INTRAVENOUS

## 2018-11-17 MED ORDER — FENTANYL CITRATE (PF) 250 MCG/5ML IJ SOLN
INTRAMUSCULAR | Status: AC
  Filled 2018-11-17: qty 5

## 2018-11-17 MED ORDER — ROCURONIUM BROMIDE 10 MG/ML (PF) SYRINGE
PREFILLED_SYRINGE | INTRAVENOUS | Status: AC
  Filled 2018-11-17: qty 10

## 2018-11-17 MED ORDER — PANTOPRAZOLE SODIUM 40 MG PO TBEC
DELAYED_RELEASE_TABLET | ORAL | Status: AC
  Filled 2018-11-17: qty 1

## 2018-11-17 MED ORDER — PROPOFOL 10 MG/ML IV BOLUS
INTRAVENOUS | Status: DC | PRN
  Administered 2018-11-17: 160 mg via INTRAVENOUS

## 2018-11-17 MED ORDER — LACTATED RINGERS IV SOLN
INTRAVENOUS | Status: DC
  Administered 2018-11-17 (×2): via INTRAVENOUS
  Filled 2018-11-17 (×3): qty 1000

## 2018-11-17 MED ORDER — ONDANSETRON HCL 4 MG/2ML IJ SOLN
4.0000 mg | Freq: Four times a day (QID) | INTRAMUSCULAR | Status: DC | PRN
  Administered 2018-11-17: 21:00:00 4 mg via INTRAVENOUS
  Filled 2018-11-17: qty 2

## 2018-11-17 MED ORDER — DOCUSATE SODIUM 100 MG PO CAPS
ORAL_CAPSULE | ORAL | Status: AC
  Filled 2018-11-17: qty 1

## 2018-11-17 MED ORDER — PANTOPRAZOLE SODIUM 40 MG IV SOLR
40.0000 mg | Freq: Every day | INTRAVENOUS | Status: DC
  Administered 2018-11-17: 22:00:00 40 mg via INTRAVENOUS
  Filled 2018-11-17: qty 40

## 2018-11-17 MED ORDER — FENTANYL CITRATE (PF) 100 MCG/2ML IJ SOLN
INTRAMUSCULAR | Status: DC | PRN
  Administered 2018-11-17: 50 ug via INTRAVENOUS
  Administered 2018-11-17: 100 ug via INTRAVENOUS
  Administered 2018-11-17 (×4): 50 ug via INTRAVENOUS

## 2018-11-17 MED ORDER — ONDANSETRON HCL 4 MG/2ML IJ SOLN
4.0000 mg | Freq: Four times a day (QID) | INTRAMUSCULAR | Status: DC | PRN
  Administered 2018-11-17: 14:00:00 4 mg via INTRAVENOUS
  Filled 2018-11-17: qty 2

## 2018-11-17 MED ORDER — ONDANSETRON HCL 4 MG/2ML IJ SOLN
INTRAMUSCULAR | Status: AC
  Filled 2018-11-17: qty 2

## 2018-11-17 MED ORDER — DEXTROSE IN LACTATED RINGERS 5 % IV SOLN
INTRAVENOUS | Status: DC
  Filled 2018-11-17: qty 1000

## 2018-11-17 MED ORDER — OXYCODONE HCL 5 MG PO TABS
ORAL_TABLET | ORAL | Status: AC
  Filled 2018-11-17: qty 2

## 2018-11-17 MED ORDER — OXYCODONE-ACETAMINOPHEN 5-325 MG PO TABS
1.0000 | ORAL_TABLET | ORAL | Status: DC | PRN
  Filled 2018-11-17: qty 2

## 2018-11-17 MED ORDER — PANTOPRAZOLE SODIUM 40 MG PO TBEC
40.0000 mg | DELAYED_RELEASE_TABLET | Freq: Every day | ORAL | Status: DC
  Administered 2018-11-17: 11:00:00 40 mg via ORAL
  Filled 2018-11-17: qty 1

## 2018-11-17 MED ORDER — KETOROLAC TROMETHAMINE 30 MG/ML IJ SOLN
30.0000 mg | Freq: Three times a day (TID) | INTRAMUSCULAR | Status: DC
  Administered 2018-11-17: 11:00:00 30 mg via INTRAVENOUS
  Filled 2018-11-17: qty 1

## 2018-11-17 MED ORDER — ONDANSETRON HCL 4 MG PO TABS
4.0000 mg | ORAL_TABLET | Freq: Four times a day (QID) | ORAL | Status: DC | PRN
  Filled 2018-11-17: qty 1

## 2018-11-17 MED ORDER — ROCURONIUM BROMIDE 10 MG/ML (PF) SYRINGE
PREFILLED_SYRINGE | INTRAVENOUS | Status: DC | PRN
  Administered 2018-11-17: 10 mg via INTRAVENOUS
  Administered 2018-11-17: 50 mg via INTRAVENOUS

## 2018-11-17 MED ORDER — HYDROMORPHONE HCL 1 MG/ML IJ SOLN
0.5000 mg | INTRAMUSCULAR | Status: DC | PRN
  Administered 2018-11-17: 11:00:00 0.5 mg via INTRAVENOUS
  Filled 2018-11-17: qty 0.5

## 2018-11-17 MED ORDER — OXYCODONE-ACETAMINOPHEN 5-325 MG PO TABS
ORAL_TABLET | ORAL | Status: AC
  Filled 2018-11-17: qty 2

## 2018-11-17 MED ORDER — OXYCODONE-ACETAMINOPHEN 5-325 MG PO TABS
1.0000 | ORAL_TABLET | ORAL | Status: DC | PRN
  Administered 2018-11-17 – 2018-11-18 (×4): 2 via ORAL
  Filled 2018-11-17: qty 2

## 2018-11-17 MED ORDER — MIDAZOLAM HCL 2 MG/2ML IJ SOLN
INTRAMUSCULAR | Status: AC
  Filled 2018-11-17: qty 2

## 2018-11-17 MED ORDER — OXYCODONE HCL 5 MG PO TABS
5.0000 mg | ORAL_TABLET | Freq: Once | ORAL | Status: DC | PRN
  Filled 2018-11-17: qty 1

## 2018-11-17 MED ORDER — KETOROLAC TROMETHAMINE 30 MG/ML IJ SOLN
30.0000 mg | Freq: Four times a day (QID) | INTRAMUSCULAR | Status: AC
  Administered 2018-11-17 – 2018-11-18 (×3): 30 mg via INTRAVENOUS
  Filled 2018-11-17: qty 1

## 2018-11-17 MED ORDER — OXYCODONE HCL 5 MG PO TABS
5.0000 mg | ORAL_TABLET | ORAL | Status: DC | PRN
  Administered 2018-11-17: 10 mg via ORAL
  Filled 2018-11-17: qty 2

## 2018-11-17 MED ORDER — LACTATED RINGERS IV SOLN
INTRAVENOUS | Status: DC
  Administered 2018-11-17 (×2): via INTRAVENOUS
  Filled 2018-11-17: qty 1000

## 2018-11-17 MED ORDER — DOCUSATE SODIUM 100 MG PO CAPS
100.0000 mg | ORAL_CAPSULE | Freq: Two times a day (BID) | ORAL | Status: DC
  Administered 2018-11-17: 100 mg via ORAL
  Filled 2018-11-17: qty 1

## 2018-11-17 MED ORDER — DOCUSATE SODIUM 100 MG PO CAPS
100.0000 mg | ORAL_CAPSULE | Freq: Two times a day (BID) | ORAL | Status: DC
  Administered 2018-11-17: 11:00:00 100 mg via ORAL
  Filled 2018-11-17: qty 1

## 2018-11-17 MED ORDER — SODIUM CHLORIDE 0.9 % IV SOLN
INTRAVENOUS | Status: AC
  Filled 2018-11-17: qty 2

## 2018-11-17 MED ORDER — ACETAMINOPHEN 10 MG/ML IV SOLN
INTRAVENOUS | Status: AC
  Filled 2018-11-17: qty 100

## 2018-11-17 MED ORDER — SUGAMMADEX SODIUM 200 MG/2ML IV SOLN
INTRAVENOUS | Status: DC | PRN
  Administered 2018-11-17: 200 mg via INTRAVENOUS

## 2018-11-17 MED ORDER — SCOPOLAMINE 1 MG/3DAYS TD PT72
MEDICATED_PATCH | TRANSDERMAL | Status: DC | PRN
  Administered 2018-11-17: 1 via TRANSDERMAL

## 2018-11-17 MED ORDER — LIDOCAINE 2% (20 MG/ML) 5 ML SYRINGE
INTRAMUSCULAR | Status: AC
  Filled 2018-11-17: qty 5

## 2018-11-17 MED ORDER — HYDROMORPHONE HCL 1 MG/ML IJ SOLN
0.5000 mg | INTRAMUSCULAR | Status: DC | PRN
  Filled 2018-11-17: qty 0.5

## 2018-11-17 MED ORDER — HYDROMORPHONE HCL 1 MG/ML IJ SOLN
INTRAMUSCULAR | Status: AC
  Filled 2018-11-17: qty 1

## 2018-11-17 MED ORDER — SCOPOLAMINE 1 MG/3DAYS TD PT72
MEDICATED_PATCH | TRANSDERMAL | Status: AC
  Filled 2018-11-17: qty 1

## 2018-11-17 MED ORDER — DEXAMETHASONE SODIUM PHOSPHATE 10 MG/ML IJ SOLN
INTRAMUSCULAR | Status: AC
  Filled 2018-11-17: qty 1

## 2018-11-17 MED ORDER — ONDANSETRON HCL 4 MG/2ML IJ SOLN
INTRAMUSCULAR | Status: DC | PRN
  Administered 2018-11-17: 4 mg via INTRAVENOUS

## 2018-11-17 SURGICAL SUPPLY — 81 items
BAG RETRIEVAL 10 (BASKET)
BAG RETRIEVAL 10MM (BASKET)
BARRIER ADHS 3X4 INTERCEED (GAUZE/BANDAGES/DRESSINGS) IMPLANT
BLADE CLIPPER SENSICLIP SURGIC (BLADE) IMPLANT
CANISTER SUCT 3000ML PPV (MISCELLANEOUS) ×4 IMPLANT
CLOSURE WOUND 1/4X4 (GAUZE/BANDAGES/DRESSINGS)
CONT PATH 16OZ SNAP LID 3702 (MISCELLANEOUS) ×4 IMPLANT
COVER BACK TABLE 60X90IN (DRAPES) ×4 IMPLANT
COVER WAND RF STERILE (DRAPES) ×4 IMPLANT
DECANTER SPIKE VIAL GLASS SM (MISCELLANEOUS) ×4 IMPLANT
DERMABOND ADVANCED (GAUZE/BANDAGES/DRESSINGS) ×2
DERMABOND ADVANCED .7 DNX12 (GAUZE/BANDAGES/DRESSINGS) ×2 IMPLANT
DRAPE LAPAROTOMY T 102X78X121 (DRAPES) ×4 IMPLANT
DRAPE WARM FLUID 44X44 (DRAPES) ×2 IMPLANT
DRSG OPSITE POSTOP 3X4 (GAUZE/BANDAGES/DRESSINGS) ×4 IMPLANT
DRSG OPSITE POSTOP 4X10 (GAUZE/BANDAGES/DRESSINGS) ×4 IMPLANT
DRSG TEGADERM 2-3/8X2-3/4 SM (GAUZE/BANDAGES/DRESSINGS) IMPLANT
DURAPREP 26ML APPLICATOR (WOUND CARE) ×4 IMPLANT
ELECT REM PT RETURN 9FT ADLT (ELECTROSURGICAL) ×4
ELECTRODE REM PT RTRN 9FT ADLT (ELECTROSURGICAL) ×2 IMPLANT
GAUZE 4X4 16PLY RFD (DISPOSABLE) ×4 IMPLANT
GLOVE BIO SURGEON STRL SZ 6 (GLOVE) ×2 IMPLANT
GLOVE BIO SURGEON STRL SZ 6.5 (GLOVE) ×9 IMPLANT
GLOVE BIO SURGEON STRL SZ7 (GLOVE) ×6 IMPLANT
GLOVE BIO SURGEONS STRL SZ 6.5 (GLOVE) ×3
GLOVE BIOGEL PI IND STRL 6 (GLOVE) IMPLANT
GLOVE BIOGEL PI IND STRL 7.0 (GLOVE) ×6 IMPLANT
GLOVE BIOGEL PI IND STRL 7.5 (GLOVE) IMPLANT
GLOVE BIOGEL PI INDICATOR 6 (GLOVE) ×2
GLOVE BIOGEL PI INDICATOR 7.0 (GLOVE) ×6
GLOVE BIOGEL PI INDICATOR 7.5 (GLOVE) ×8
GLOVE ECLIPSE 6.5 STRL STRAW (GLOVE) ×4 IMPLANT
GOWN STRL REUS W/ TWL LRG LVL3 (GOWN DISPOSABLE) ×6 IMPLANT
GOWN STRL REUS W/TWL LRG LVL3 (GOWN DISPOSABLE) ×4 IMPLANT
HIBICLENS CHG 4% 4OZ BTL (MISCELLANEOUS) ×4 IMPLANT
HOLDER FOLEY CATH W/STRAP (MISCELLANEOUS) ×4 IMPLANT
KIT TURNOVER CYSTO (KITS) ×4 IMPLANT
NS IRRIG 1000ML POUR BTL (IV SOLUTION) ×4 IMPLANT
NS IRRIG 500ML POUR BTL (IV SOLUTION) ×4 IMPLANT
PACK ABDOMINAL GYN (CUSTOM PROCEDURE TRAY) ×4 IMPLANT
PACK LAVH (CUSTOM PROCEDURE TRAY) ×4 IMPLANT
PACK ROBOTIC GOWN (GOWN DISPOSABLE) ×2 IMPLANT
PACK TRENDGUARD 450 HYBRID PRO (MISCELLANEOUS) IMPLANT
PAD OB MATERNITY 4.3X12.25 (PERSONAL CARE ITEMS) ×4 IMPLANT
PAD PREP 24X48 CUFFED NSTRL (MISCELLANEOUS) ×4 IMPLANT
PROTECTOR NERVE ULNAR (MISCELLANEOUS) ×8 IMPLANT
SCISSORS LAP 5X35 DISP (ENDOMECHANICALS) IMPLANT
SEALER TISSUE G2 CVD JAW 45CM (ENDOMECHANICALS) ×4 IMPLANT
SET IRRIG TUBING LAPAROSCOPIC (IRRIGATION / IRRIGATOR) IMPLANT
SPONGE LAP 18X18 RF (DISPOSABLE) ×4 IMPLANT
SPONGE LAP 18X18 X RAY DECT (DISPOSABLE) ×4 IMPLANT
STRIP CLOSURE SKIN 1/4X4 (GAUZE/BANDAGES/DRESSINGS) IMPLANT
SUT MNCRL 0 1X36 CT-1 (SUTURE) ×2 IMPLANT
SUT MNCRL 0 MO-4 VIOLET 18 CR (SUTURE) ×4 IMPLANT
SUT MNCRL 0 VIOLET 6X18 (SUTURE) ×2 IMPLANT
SUT MON AB-0 CT1 36 (SUTURE) ×6 IMPLANT
SUT MONOCRYL 0 (SUTURE) ×2
SUT MONOCRYL 0 6X18 (SUTURE) ×2
SUT MONOCRYL 0 MO 4 18  CR/8 (SUTURE) ×4
SUT PDS AB 0 CTX 60 (SUTURE) ×4 IMPLANT
SUT PLAIN 2 0 XLH (SUTURE) IMPLANT
SUT VIC AB 3-0 PS2 18 (SUTURE) ×2
SUT VIC AB 3-0 PS2 18XBRD (SUTURE) ×2 IMPLANT
SUT VIC AB 3-0 SH 27 (SUTURE)
SUT VIC AB 3-0 SH 27X BRD (SUTURE) IMPLANT
SUT VIC AB 3-0 X1 27 (SUTURE) IMPLANT
SUT VIC AB 4-0 KS 27 (SUTURE) ×4 IMPLANT
SUT VIC AB 4-0 PS2 27 (SUTURE) ×2 IMPLANT
SUT VICRYL 0 UR6 27IN ABS (SUTURE) ×4 IMPLANT
SYR 3ML 23GX1 SAFETY (SYRINGE) IMPLANT
SYR BULB IRRIGATION 50ML (SYRINGE) ×4 IMPLANT
SYS BAG RETRIEVAL 10MM (BASKET)
SYSTEM BAG RETRIEVAL 10MM (BASKET) IMPLANT
TOWEL OR 17X26 10 PK STRL BLUE (TOWEL DISPOSABLE) ×8 IMPLANT
TRAY FOLEY W/BAG SLVR 14FR (SET/KITS/TRAYS/PACK) ×4 IMPLANT
TRENDGUARD 450 HYBRID PRO PACK (MISCELLANEOUS)
TROCAR OPTI TIP 5M 100M (ENDOMECHANICALS) ×4 IMPLANT
TROCAR XCEL NON-BLD 11X100MML (ENDOMECHANICALS) ×4 IMPLANT
TUBING EVAC SMOKE HEATED PNEUM (TUBING) ×4 IMPLANT
WARMER LAPAROSCOPE (MISCELLANEOUS) ×4 IMPLANT
WATER STERILE IRR 500ML POUR (IV SOLUTION) ×4 IMPLANT

## 2018-11-17 NOTE — Anesthesia Preprocedure Evaluation (Signed)
Anesthesia Evaluation  Patient identified by MRN, date of birth, ID band Patient awake    Reviewed: Allergy & Precautions, H&P , NPO status , Patient's Chart, lab work & pertinent test results  Airway Mallampati: II   Neck ROM: full    Dental   Pulmonary neg pulmonary ROS,    breath sounds clear to auscultation       Cardiovascular negative cardio ROS   Rhythm:regular Rate:Normal     Neuro/Psych    GI/Hepatic   Endo/Other  obese  Renal/GU      Musculoskeletal   Abdominal   Peds  Hematology  (+) Blood dyscrasia, anemia ,   Anesthesia Other Findings   Reproductive/Obstetrics menorrhagia                             Anesthesia Physical Anesthesia Plan  ASA: II  Anesthesia Plan: General   Post-op Pain Management:    Induction: Intravenous  PONV Risk Score and Plan: 3 and Ondansetron, Dexamethasone, Midazolam, Scopolamine patch - Pre-op and Treatment may vary due to age or medical condition  Airway Management Planned: Oral ETT  Additional Equipment:   Intra-op Plan:   Post-operative Plan: Extubation in OR  Informed Consent: I have reviewed the patients History and Physical, chart, labs and discussed the procedure including the risks, benefits and alternatives for the proposed anesthesia with the patient or authorized representative who has indicated his/her understanding and acceptance.       Plan Discussed with: CRNA, Anesthesiologist and Surgeon  Anesthesia Plan Comments:         Anesthesia Quick Evaluation

## 2018-11-17 NOTE — Progress Notes (Signed)
Called Dr Julien Girt about pain management.  Orders given for oxy ir and to change dilaudid to Q 2 hours.

## 2018-11-17 NOTE — Anesthesia Procedure Notes (Signed)
Procedure Name: Intubation Date/Time: 11/17/2018 7:31 AM Performed by: Ajahni Nay D, CRNA Pre-anesthesia Checklist: Patient identified, Emergency Drugs available, Suction available and Patient being monitored Patient Re-evaluated:Patient Re-evaluated prior to induction Oxygen Delivery Method: Circle system utilized Preoxygenation: Pre-oxygenation with 100% oxygen Induction Type: IV induction Ventilation: Mask ventilation without difficulty Laryngoscope Size: Mac and 3 Grade View: Grade I Tube type: Oral Tube size: 7.0 mm Number of attempts: 1 Airway Equipment and Method: Stylet Placement Confirmation: ETT inserted through vocal cords under direct vision,  positive ETCO2 and breath sounds checked- equal and bilateral Secured at: 21 cm Tube secured with: Tape Dental Injury: Teeth and Oropharynx as per pre-operative assessment

## 2018-11-17 NOTE — Op Note (Signed)
Hysterectomy Procedure Note  Indications: fibroids, menorrhagia  Pre-operative Diagnosis: fibroid  Post-operative Diagnosis: same  Operation:  Laparoscopy, Total abdominal hysterectomy, bilateral salpingectomy  Surgeon: Marylynn Pearson   Assistants: Linda Hedges, MD  Anesthesia: General endotracheal anesthesia  Procedure Details  The patient was seen in the Holding Room. The risks, benefits, complications, treatment options, and expected outcomes were discussed with the patient.  The patient concurred with the proposed plan, giving informed consent.  The site of surgery properly noted/marked. The patient was taken to Operating Room # 1, identified as Robin Townsend and the procedure verified. A Time Out was held and the above information confirmed.  After induction of anesthesia, the patient was draped and prepped in the usual sterile manner. Pt was placed in dorsal lithotomy position after anesthesia and draped and prepped in the usual sterile manner. Foley catheter was placed.  Hulka clamp was placed on the cervix.  .25% marcaine was injected and an infraumbilical incision was made.  Optical trocar inserted under direct visualization.  Fibroid was noted to be extending into the right broad ligament with some adhesions of the right fallopian tube to the right ovary.  Decision was made to proceede with abdominal incision.  Umbilical incision was closed with a deep stitch and skin closed with vicryl.    A transverse incision was made and carried through the subcutaneous tissue to the fascia. Fascial incision was made and extended laterally. The rectus muscles were separated. The peritoneum was identified and entered. Peritoneal incision was extended longitudinally.  Self retaining retractor was placed and bowel was packed away from the surgical site.   The round ligaments were identified, cut, and ligated with 0-Vicryl. The anterior peritoneal reflection was incised and the bladder was  dissected off the lower uterine segment. The retroperitoneal space was explored and the ureters were identified bilaterally. The right utero-ovarian ligament and proximal fallopian tube were grasped, cut, and suture ligated with 0-Vicryl. The left utero-ovarian ligament and proximal fallopian tube were grasped, cut and suture ligated with 0-Vicryl. Hemostasis  was observed. The uterine vessels were skeletonized, then clamped, cut and suture ligated with 0-Vicryl suture. Serial pedicles of the cardinal and utero-sacral ligaments were clamped, cut, and suture ligated with 0-Vicryl. Entrance was made into the vagina and the uterus removed. Vaginal cuff angle sutures were placed incorporating the utero-sacral ligaments for support. The vaginal cuff was then closed with a running stitch of 0- Vicryl. Lavage was carried out until clear. Hemostasis was observed.  Bilateral fallopian tubes and sterilization clips were then clamped and excised.  Pedicles were doubly suture ligated and hemostatic  Retractor and all packing was removed from the abdomen. The fascia was approximated with running sutures of 0-PDS. Lavage was again carried out. Hemostasis was observed. The skin was approximated with 4-0 vicryl.  Instrument, sponge, and needle counts were correct prior to abdominal closure and at the conclusion of the case.   Findings: fibroid  Estimated Blood Loss:  450cc                     Specimens: uterus and bilateral fallopian tubes              Complications:  None; patient tolerated the procedure well.         Disposition: PACU - hemodynamically stable.         Condition: stable  Attending Attestation: I was present and scrubbed for the entire procedure.

## 2018-11-17 NOTE — Progress Notes (Signed)
Day of Surgery Procedure(s) (LRB): ATTEMPTED LAPAROSCOPIC ASSISTED VAGINAL HYSTERECTOMY CONVERTED TO OPEN ABDOMINAL HYSTERECTOMY WITH SALPINGECTOMY, (Bilateral)  Subjective: Patient reports incisional pain and tolerating PO.    Objective: I have reviewed patient's vital signs, intake and output and medications.  General: alert and cooperative GI: normal findings: soft, non-tender  Assessment: s/p Procedure(s): ATTEMPTED LAPAROSCOPIC ASSISTED VAGINAL HYSTERECTOMY CONVERTED TO OPEN ABDOMINAL HYSTERECTOMY WITH SALPINGECTOMY, (Bilateral): stable  Plan: Advance diet Encourage ambulation Advance to PO medication Discontinue IV fluids  LOS: 0 days    Robin Townsend 11/17/2018, 5:17 PM

## 2018-11-17 NOTE — Op Note (Signed)
11/17/2018  9:19 AM  PATIENT:  Robin Townsend  43 y.o. female  PRE-OPERATIVE DIAGNOSIS:  menorrhagia  POST-OPERATIVE DIAGNOSIS:  menorrhagia  PROCEDURE:  Procedure(s): ATTEMPTED LAPAROSCOPIC ASSISTED VAGINAL HYSTERECTOMY CONVERTED TO OPEN ABDOMINAL HYSTERECTOMY WITH SALPINGECTOMY, (Bilateral)  SURGEON:  Surgeon(s) and Role:    * Marylynn Pearson, MD - Primary    * Morris, Megan, DO - Assisting  ANESTHESIA:   general  EBL:  450 mL   BLOOD ADMINISTERED:none  DRAINS: none   LOCAL MEDICATIONS USED:  MARCAINE     SPECIMEN:  Uterus and bilateral fallopian tubes  DISPOSITION OF SPECIMEN:  PATHOLOGY  COUNTS:  YES  TOURNIQUET:  * No tourniquets in log *  DICTATION: .Other Dictation: Dictation Number pending  PLAN OF CARE: Admit for overnight observation  PATIENT DISPOSITION:  PACU - hemodynamically stable.   Delay start of Pharmacological VTE agent (>24hrs) due to surgical blood loss or risk of bleeding: no

## 2018-11-17 NOTE — Transfer of Care (Signed)
Immediate Anesthesia Transfer of Care Note  Patient: Aamina Henryhand  Procedure(s) Performed: ATTEMPTED LAPAROSCOPIC ASSISTED VAGINAL HYSTERECTOMY CONVERTED TO OPEN ABDOMINAL HYSTERECTOMY WITH SALPINGECTOMY, (Bilateral Abdomen)  Patient Location: PACU  Anesthesia Type:General  Level of Consciousness: awake, alert  and oriented  Airway & Oxygen Therapy: Patient Spontanous Breathing and Patient connected to face mask oxygen  Post-op Assessment: Report given to RN and Post -op Vital signs reviewed and stable  Post vital signs: Reviewed and stable  Last Vitals:  Vitals Value Taken Time  BP 155/103 11/17/18 0922  Temp    Pulse 77 11/17/18 0922  Resp 13 11/17/18 0922  SpO2 100 % 11/17/18 0922  Vitals shown include unvalidated device data.  Last Pain:  Vitals:   11/17/18 0538  TempSrc: Oral         Complications: No apparent anesthesia complications

## 2018-11-18 DIAGNOSIS — D259 Leiomyoma of uterus, unspecified: Secondary | ICD-10-CM | POA: Diagnosis not present

## 2018-11-18 LAB — CBC
HCT: 24.8 % — ABNORMAL LOW (ref 36.0–46.0)
Hemoglobin: 7.2 g/dL — ABNORMAL LOW (ref 12.0–15.0)
MCH: 22.8 pg — ABNORMAL LOW (ref 26.0–34.0)
MCHC: 29 g/dL — ABNORMAL LOW (ref 30.0–36.0)
MCV: 78.5 fL — ABNORMAL LOW (ref 80.0–100.0)
Platelets: 223 10*3/uL (ref 150–400)
RBC: 3.16 MIL/uL — ABNORMAL LOW (ref 3.87–5.11)
RDW: 18.9 % — ABNORMAL HIGH (ref 11.5–15.5)
WBC: 11.5 10*3/uL — ABNORMAL HIGH (ref 4.0–10.5)
nRBC: 0 % (ref 0.0–0.2)

## 2018-11-18 MED ORDER — KETOROLAC TROMETHAMINE 30 MG/ML IJ SOLN
INTRAMUSCULAR | Status: AC
  Filled 2018-11-18: qty 1

## 2018-11-18 MED ORDER — OXYCODONE-ACETAMINOPHEN 5-325 MG PO TABS
ORAL_TABLET | ORAL | Status: AC
  Filled 2018-11-18: qty 2

## 2018-11-18 MED ORDER — IBUPROFEN 600 MG PO TABS: 600.0000 mg | ORAL_TABLET | Freq: Four times a day (QID) | ORAL | 1 refills | Status: DC | PRN

## 2018-11-18 MED ORDER — OXYCODONE-ACETAMINOPHEN 5-325 MG PO TABS: 1.0000 | ORAL_TABLET | ORAL | 0 refills | Status: DC | PRN

## 2018-11-18 NOTE — Anesthesia Postprocedure Evaluation (Signed)
Anesthesia Post Note  Patient: Robin Townsend  Procedure(s) Performed: ATTEMPTED LAPAROSCOPIC ASSISTED VAGINAL HYSTERECTOMY CONVERTED TO OPEN ABDOMINAL HYSTERECTOMY WITH SALPINGECTOMY, (Bilateral Abdomen)     Patient location during evaluation: PACU Anesthesia Type: General Level of consciousness: awake and alert Pain management: pain level controlled Vital Signs Assessment: post-procedure vital signs reviewed and stable Respiratory status: spontaneous breathing, nonlabored ventilation, respiratory function stable and patient connected to nasal cannula oxygen Cardiovascular status: blood pressure returned to baseline and stable Postop Assessment: no apparent nausea or vomiting Anesthetic complications: no    Last Vitals:  Vitals:   11/18/18 0500 11/18/18 0755  BP: 127/89 134/81  Pulse: (!) 101 (!) 105  Resp: 16 18  Temp: 36.8 C 36.9 C  SpO2: 98% 100%    Last Pain:  Vitals:   11/18/18 0755  TempSrc:   PainSc: [place]

## 2018-11-18 NOTE — Discharge Summary (Signed)
Physician Discharge Summary  Patient ID: Robin Townsend MRN: [name] DOB/AGE: February 26, 1976 43 y.o.  Admit date: 11/17/2018 Discharge date: 11/18/2018  Admission Diagnoses: fibroids  Discharge Diagnoses:  Active Problems:   Fibroids   Discharged Condition: stable  Hospital Course: she was admitted for postoperative care.  Initially her pain was controlled with IV medications.  Once she was able to tolerate PO, her [pain was controlled with PO medications.  Her vitals and labs were stable.  She was able to ambulate.  She was able to void after her foley was removed.    Consults: None  Significant Diagnostic Studies: labs: cbc  Treatments: surgery: TAH/BS  Discharge Exam: Blood pressure 127/89, pulse (!) 101, temperature 98.3 F (36.8 C), resp. rate 16, height 5' 4.5" (1.638 m), weight 86 kg, last menstrual period 10/26/2018, SpO2 98 %. General appearance: alert and cooperative GI: normal findings: soft, non-tender Incision - bandange clean Disposition: Discharge disposition: 01-Home or Self Care       Discharge Instructions    Diet - low sodium heart healthy   Complete by: As directed    Increase activity slowly   Complete by: As directed      Allergies as of 11/18/2018      Reactions   Naproxen Sodium Swelling      Medication List    TAKE these medications   ferrous sulfate 325 (65 FE) MG tablet TAKE 1 TABLET(325 MG) BY MOUTH DAILY WITH BREAKFAST What changed: See the new instructions.   ibuprofen 600 MG tablet Commonly known as: Advil Take 1 tablet (600 mg total) by mouth every 6 (six) hours as needed for mild pain. What changed:   medication strength  reasons to take this   Multivitamin Adults Tabs Take 1 tablet by mouth daily. What changed: when to take this   oxyCODONE-acetaminophen 5-325 MG tablet Commonly known as: PERCOCET/ROXICET Take 1-2 tablets by mouth every 4 (four) hours as needed for moderate pain.      Follow-up Information    Marylynn Pearson, MD. Schedule an appointment as soon as possible for a visit in 2 week(s).   Specialty: Obstetrics and Gynecology Contact information: [address] [place], [place] [address] [phone]           Signed: Marylynn Pearson 11/18/2018, 7:44 AM

## 2018-11-19 ENCOUNTER — Encounter (HOSPITAL_BASED_OUTPATIENT_CLINIC_OR_DEPARTMENT_OTHER): Payer: Self-pay | Admitting: Obstetrics and Gynecology

## 2019-08-03 DIAGNOSIS — Z01419 Encounter for gynecological examination (general) (routine) without abnormal findings: Secondary | ICD-10-CM | POA: Diagnosis not present

## 2019-08-03 DIAGNOSIS — Z6834 Body mass index (BMI) 34.0-34.9, adult: Secondary | ICD-10-CM | POA: Diagnosis not present

## 2019-08-03 DIAGNOSIS — Z1231 Encounter for screening mammogram for malignant neoplasm of breast: Secondary | ICD-10-CM | POA: Diagnosis not present

## 2019-08-03 LAB — RESULTS CONSOLE HPV: CHL HPV: NEGATIVE

## 2019-08-03 LAB — HM PAP SMEAR

## 2019-08-05 ENCOUNTER — Other Ambulatory Visit: Payer: Self-pay | Admitting: Obstetrics and Gynecology

## 2019-08-05 DIAGNOSIS — R928 Other abnormal and inconclusive findings on diagnostic imaging of breast: Secondary | ICD-10-CM

## 2019-08-06 ENCOUNTER — Other Ambulatory Visit: Payer: Self-pay | Admitting: Obstetrics and Gynecology

## 2019-08-06 ENCOUNTER — Other Ambulatory Visit: Payer: Self-pay

## 2019-08-06 ENCOUNTER — Ambulatory Visit
Admission: RE | Admit: 2019-08-06 | Discharge: 2019-08-06 | Disposition: A | Discharge status: Home / Self Care | Payer: BC Managed Care – PPO | Source: Ambulatory Visit | Attending: Obstetrics and Gynecology | Admitting: Obstetrics and Gynecology

## 2019-08-06 DIAGNOSIS — R928 Other abnormal and inconclusive findings on diagnostic imaging of breast: Secondary | ICD-10-CM

## 2019-08-06 DIAGNOSIS — R922 Inconclusive mammogram: Secondary | ICD-10-CM | POA: Diagnosis not present

## 2019-08-06 DIAGNOSIS — N6489 Other specified disorders of breast: Secondary | ICD-10-CM | POA: Diagnosis not present

## 2019-08-16 ENCOUNTER — Other Ambulatory Visit: Payer: Self-pay | Admitting: Obstetrics and Gynecology

## 2019-08-16 ENCOUNTER — Ambulatory Visit
Admission: RE | Admit: 2019-08-16 | Discharge: 2019-08-16 | Disposition: A | Discharge status: Home / Self Care | Payer: BC Managed Care – PPO | Source: Ambulatory Visit | Attending: Obstetrics and Gynecology | Admitting: Obstetrics and Gynecology

## 2019-08-16 ENCOUNTER — Other Ambulatory Visit: Payer: Self-pay

## 2019-08-16 DIAGNOSIS — N6489 Other specified disorders of breast: Secondary | ICD-10-CM

## 2019-08-16 DIAGNOSIS — R928 Other abnormal and inconclusive findings on diagnostic imaging of breast: Secondary | ICD-10-CM

## 2019-11-19 DIAGNOSIS — Z20822 Contact with and (suspected) exposure to covid-19: Secondary | ICD-10-CM | POA: Diagnosis not present

## 2020-02-16 ENCOUNTER — Other Ambulatory Visit: Payer: Self-pay

## 2020-02-16 ENCOUNTER — Ambulatory Visit
Admission: RE | Admit: 2020-02-16 | Discharge: 2020-02-16 | Disposition: A | Discharge status: Home / Self Care | Payer: BC Managed Care – PPO | Source: Ambulatory Visit | Attending: Obstetrics and Gynecology | Admitting: Obstetrics and Gynecology

## 2020-02-16 DIAGNOSIS — N6489 Other specified disorders of breast: Secondary | ICD-10-CM | POA: Diagnosis not present

## 2020-08-28 DIAGNOSIS — Z113 Encounter for screening for infections with a predominantly sexual mode of transmission: Secondary | ICD-10-CM | POA: Diagnosis not present

## 2020-08-28 DIAGNOSIS — Z6835 Body mass index (BMI) 35.0-35.9, adult: Secondary | ICD-10-CM | POA: Diagnosis not present

## 2020-08-28 DIAGNOSIS — Z01419 Encounter for gynecological examination (general) (routine) without abnormal findings: Secondary | ICD-10-CM | POA: Diagnosis not present

## 2020-08-28 DIAGNOSIS — Z1231 Encounter for screening mammogram for malignant neoplasm of breast: Secondary | ICD-10-CM | POA: Diagnosis not present

## 2020-08-28 LAB — HM MAMMOGRAPHY

## 2021-01-19 ENCOUNTER — Other Ambulatory Visit: Payer: Self-pay

## 2021-01-19 ENCOUNTER — Ambulatory Visit (INDEPENDENT_AMBULATORY_CARE_PROVIDER_SITE_OTHER): Payer: BC Managed Care – PPO

## 2021-01-19 ENCOUNTER — Ambulatory Visit: Payer: BC Managed Care – PPO | Admitting: Podiatry

## 2021-01-19 ENCOUNTER — Encounter: Payer: Self-pay | Admitting: Podiatry

## 2021-01-19 DIAGNOSIS — M79671 Pain in right foot: Secondary | ICD-10-CM | POA: Diagnosis not present

## 2021-01-19 DIAGNOSIS — M722 Plantar fascial fibromatosis: Secondary | ICD-10-CM | POA: Diagnosis not present

## 2021-01-19 MED ORDER — TRIAMCINOLONE ACETONIDE 10 MG/ML IJ SUSP
10.0000 mg | Freq: Once | INTRAMUSCULAR | Status: AC
  Administered 2021-01-19: 10 mg

## 2021-01-21 NOTE — Progress Notes (Signed)
Subjective:   Patient ID: Robin Townsend, female   DOB: 45 y.o.   MRN: [id number]   HPI Patient presents with a lot of pain in her right heel that is been going on for a long time.  States that it is worse when she gets up in the morning after periods of sitting and has had previous treatments and has tried over-the-counter insoles.  Patient also has used ice does not smoke likes to be active   Review of Systems  All other systems reviewed and are negative.      Objective:  Physical Exam Vitals and nursing note reviewed.  Constitutional:      Appearance: She is well-developed.  Pulmonary:     Effort: Pulmonary effort is normal.  Musculoskeletal:        General: Normal range of motion.  Skin:    General: Skin is warm.  Neurological:     Mental Status: She is alert.    Neurovascular status intact muscle strength adequate range of motion adequate with patient found to have discomfort in the plantar aspect of the right heel at the insertional point tendon calcaneus with some issues as far as stretch goes with pain that is worse when she gets up in the morning and after periods of sitting.  Patient has good digital perfusion well oriented x3     Assessment:  Acute plantar fasciitis right with inflammation fluid of the medial band at insertion calcaneus     Plan:  H&P x-rays reviewed condition discussed.  At this point I did go ahead and I did a sterile prep and injected the medial band at insertion 3 mg Kenalog 5 mg Xylocaine I did dispense a night splint that I want her to start using along with heat ice therapy and educated her on anti-inflammatories.  Reappoint for [place] to recheck again in 4 weeks or earlier if needed  X-rays indicate that there is spur formation no indication stress fracture arthritis x-ray report

## 2021-01-22 ENCOUNTER — Telehealth: Payer: Self-pay | Admitting: Podiatry

## 2021-01-22 NOTE — Telephone Encounter (Signed)
Patient called to inquire about medication she thought she was being prescribed at her last appointment? Nothing has been called into her pharmacy, and according to the conversation they had she was under the impression he was gonna call something in, but is unsure what. Please advise   [facility] uses [facility] in [place] on [address] if you call the medication in.

## 2021-01-24 ENCOUNTER — Other Ambulatory Visit: Payer: Self-pay | Admitting: Podiatry

## 2021-01-24 MED ORDER — DICLOFENAC SODIUM 75 MG PO TBEC
75.0000 mg | DELAYED_RELEASE_TABLET | Freq: Two times a day (BID) | ORAL | 2 refills | Status: DC
Start: 2021-01-24 — End: 2021-05-31

## 2021-01-24 NOTE — Telephone Encounter (Signed)
Sent antiinflammatory to pharmacy

## 2021-01-25 ENCOUNTER — Other Ambulatory Visit: Payer: Self-pay | Admitting: Podiatry

## 2021-01-25 DIAGNOSIS — M722 Plantar fascial fibromatosis: Secondary | ICD-10-CM

## 2021-02-19 ENCOUNTER — Ambulatory Visit: Payer: BC Managed Care – PPO | Admitting: Podiatry

## 2021-05-31 ENCOUNTER — Encounter: Payer: Self-pay | Admitting: Nurse Practitioner

## 2021-05-31 ENCOUNTER — Ambulatory Visit: Payer: BC Managed Care – PPO | Admitting: Nurse Practitioner

## 2021-05-31 VITALS — BP 126/86 | HR 88 | Temp 97.0°F | Ht 64.75 in | Wt 225.8 lb

## 2021-05-31 DIAGNOSIS — E6609 Other obesity due to excess calories: Secondary | ICD-10-CM | POA: Diagnosis not present

## 2021-05-31 DIAGNOSIS — Z6837 Body mass index (BMI) 37.0-37.9, adult: Secondary | ICD-10-CM

## 2021-05-31 DIAGNOSIS — Z13220 Encounter for screening for lipoid disorders: Secondary | ICD-10-CM | POA: Diagnosis not present

## 2021-05-31 DIAGNOSIS — Z136 Encounter for screening for cardiovascular disorders: Secondary | ICD-10-CM | POA: Diagnosis not present

## 2021-05-31 DIAGNOSIS — Z0001 Encounter for general adult medical examination with abnormal findings: Secondary | ICD-10-CM

## 2021-05-31 LAB — LIPID PANEL
Cholesterol: 196 mg/dL (ref 0–200)
HDL: 59.9 mg/dL (ref >39.00)
LDL Cholesterol: 121 mg/dL — ABNORMAL HIGH (ref 0–99)
NonHDL: 135.63
Total CHOL/HDL Ratio: 3
Triglycerides: 75 mg/dL (ref 0.0–149.0)
VLDL: 15 mg/dL (ref 0.0–40.0)

## 2021-05-31 LAB — CBC WITH DIFFERENTIAL/PLATELET
Basophils Absolute: 0 10*3/uL (ref 0.0–0.1)
Basophils Relative: 0.8 % (ref 0.0–3.0)
Eosinophils Absolute: 0.1 10*3/uL (ref 0.0–0.7)
Eosinophils Relative: 2.1 % (ref 0.0–5.0)
HCT: 40.3 % (ref 36.0–46.0)
Hemoglobin: 13.2 g/dL (ref 12.0–15.0)
Lymphocytes Relative: 36.3 % (ref 12.0–46.0)
Lymphs Abs: 1.9 10*3/uL (ref 0.7–4.0)
MCHC: 32.8 g/dL (ref 30.0–36.0)
MCV: 84 fl (ref 78.0–100.0)
Monocytes Absolute: 0.4 10*3/uL (ref 0.1–1.0)
Monocytes Relative: 7.1 % (ref 3.0–12.0)
Neutro Abs: 2.8 10*3/uL (ref 1.4–7.7)
Neutrophils Relative %: 53.7 % (ref 43.0–77.0)
Platelets: 220 10*3/uL (ref 150.0–400.0)
RBC: 4.79 Mil/uL (ref 3.87–5.11)
RDW: 13.5 % (ref 11.5–15.5)
WBC: 5.2 10*3/uL (ref 4.0–10.5)

## 2021-05-31 LAB — COMPREHENSIVE METABOLIC PANEL
ALT: 29 U/L (ref 0–35)
AST: 19 U/L (ref 0–37)
Albumin: 4.2 g/dL (ref 3.5–5.2)
Alkaline Phosphatase: 67 U/L (ref 39–117)
BUN: 10 mg/dL (ref 6–23)
CO2: 27 mEq/L (ref 19–32)
Calcium: 9.4 mg/dL (ref 8.4–10.5)
Chloride: 105 mEq/L (ref 96–112)
Creatinine, Ser: 0.77 mg/dL (ref 0.40–1.20)
GFR: 93.72 mL/min (ref >60.00)
Glucose, Bld: 92 mg/dL (ref 70–99)
Potassium: 4.5 mEq/L (ref 3.5–5.1)
Sodium: 138 mEq/L (ref 135–145)
Total Bilirubin: 0.3 mg/dL (ref 0.2–1.2)
Total Protein: 7.2 g/dL (ref 6.0–8.3)

## 2021-05-31 LAB — TSH: TSH: 1.83 u[IU]/mL (ref 0.35–5.50)

## 2021-05-31 NOTE — Progress Notes (Signed)
? ?Complete physical exam ? ?Patient: Robin Townsend   DOB: 08/20/1975   46 y.o. Female  MRN: [id number] ?Visit Date: 05/31/2021 ? ?Subjective:  ?  ?Chief Complaint  ?Patient presents with  ? Establish Care  ?  New patient/Physical-No breast or pap exam needed, pt has GYN (letter sent to retrieve records) ?Pt is fasting.  ?No other issues or concerns.   ? ? ?Robin Townsend is a 46 y.o. female who presents today for a complete physical exam. She reports consuming a general diet.  Walking 2x/week, 70ms each  She generally feels well. She reports sleeping well. She does not have additional problems to discuss today.  ?Vision:Yes ?Dental:Yes ?STD Screen:No ?GY: Dr. AJulien Girt?Most recent fall risk assessment: ? ?  06/12/2016  ?  9:11 AM  ?Fall Risk   ?Falls in the past year? No  ? ?  ?Most recent depression screenings: ? ?  05/31/2021  ?  8:13 AM 06/12/2016  ?  9:11 AM  ?PHQ 2/9 Scores  ?PHQ - 2 Score 0 0  ? ? ?HPI  ?No problem-specific Assessment & Plan notes found for this encounter. ? ? ?Past Medical History:  ?Diagnosis Date  ? Anemia   ? Fibroid   ? Heart murmur   ? Not sure when was first diagnosis  ? Irregular heart beat   ? dx as youth , told her she had a heart murmur but reports non symptomatic   ? Menorrhagia   ? ?Past Surgical History:  ?Procedure Laterality Date  ? ABDOMINAL HYSTERECTOMY  11/2018  ? LAPAROSCOPIC VAGINAL HYSTERECTOMY WITH SALPINGECTOMY Bilateral 11/17/2018  ? Procedure: ATTEMPTED LAPAROSCOPIC ASSISTED VAGINAL HYSTERECTOMY CONVERTED TO OPEN ABDOMINAL HYSTERECTOMY WITH SALPINGECTOMY,;  Surgeon: AMarylynn Pearson MD;  Location: [facility]  Service: Gynecology;  Laterality: Bilateral;  ? TUBAL LIGATION  03/12/2003  ? ?Social History  ? ?Socioeconomic History  ? Marital status: Married  ?  Spouse name: Not on file  ? Number of children: 2  ? Years of education: Not on file  ? Highest education level: Not on file  ?Occupational History  ? Occupation: mortgage specialist  ?Tobacco Use  ?  Smoking status: Never  ? Smokeless tobacco: Never  ?Substance and Sexual Activity  ? Alcohol use: Yes  ?  Alcohol/week: 2.0 standard drinks  ?  Types: 2 Glasses of wine per week  ?  Comment: 2 times a week   ? Drug use: No  ? Sexual activity: Yes  ?  Partners: Male  ?  Birth control/protection: None  ?Other Topics Concern  ? Not on file  ?Social History Narrative  ? Not on file  ? ?Social Determinants of Health  ? ?Financial Resource Strain: Not on file  ?Food Insecurity: Not on file  ?Transportation Needs: Not on file  ?Physical Activity: Not on file  ?Stress: Not on file  ?Social Connections: Not on file  ?Intimate Partner Violence: Not on file  ? ?Family Status  ?Relation Name Status  ? Mother  (Not Specified)  ? Father Mel Chazer Henryhand Deceased  ? Sister  (Not Specified)  ? Neg Hx  (Not Specified)  ? ?Family History  ?Problem Relation Age of Onset  ? Hyperlipidemia Mother   ? Hypertension Mother   ? Cancer Father   ?     lung cancer  ? Anemia Sister   ? Hyperlipidemia Sister   ? Hypertension Sister   ? Colon cancer Neg Hx   ? ?Allergies  ?Allergen Reactions  ?  Naproxen Sodium Swelling  ?  ?Patient Care Team: ?Shlomie Romig, Charlene Brooke, NP as PCP - General (Internal Medicine) ?[place]  ? ?Medications: ?Outpatient Medications Prior to Visit  ?Medication Sig  ? [DISCONTINUED] diclofenac (VOLTAREN) 75 MG EC tablet Take 1 tablet (75 mg total) by mouth 2 (two) times daily. (Patient not taking: Reported on 05/31/2021)  ? [DISCONTINUED] ferrous sulfate 325 (65 FE) MG tablet TAKE 1 TABLET(325 MG) BY MOUTH DAILY WITH BREAKFAST (Patient not taking: Reported on 05/31/2021)  ? [DISCONTINUED] ibuprofen (ADVIL) 600 MG tablet Take 1 tablet (600 mg total) by mouth every 6 (six) hours as needed for mild pain. (Patient not taking: Reported on 05/31/2021)  ? [DISCONTINUED] Multiple Vitamins-Minerals (MULTIVITAMIN ADULTS) TABS Take 1 tablet by mouth daily. (Patient not taking: Reported on 05/31/2021)  ?  [DISCONTINUED] oxyCODONE-acetaminophen (PERCOCET/ROXICET) 5-325 MG tablet Take 1-2 tablets by mouth every 4 (four) hours as needed for moderate pain. (Patient not taking: Reported on 05/31/2021)  ? ?No facility-administered medications prior to visit.  ? ? ?Review of Systems  ?Constitutional:  Negative for fever.  ?HENT:  Negative for congestion and sore throat.   ?Eyes:   ?     Negative for visual changes  ?Respiratory:  Negative for cough and shortness of breath.   ?Cardiovascular:  Negative for chest pain, palpitations and leg swelling.  ?Gastrointestinal:  Negative for blood in stool, constipation and diarrhea.  ?Genitourinary:  Negative for dysuria, frequency and urgency.  ?Musculoskeletal:  Negative for myalgias.  ?Skin:  Negative for rash.  ?Neurological:  Negative for dizziness and headaches.  ?Hematological:  Does not bruise/bleed easily.  ?Psychiatric/Behavioral:  Negative for behavioral problems and suicidal ideas. The patient is not nervous/anxious.   ? ? ?   ?Objective:  ?BP 126/86 (BP Location: Left Arm, Patient Position: Sitting, Cuff Size: Large)   Pulse 88   Temp (!) 97 ?F (36.1 ?C) (Temporal)   Ht 5' 4.75" (1.645 m)   Wt 225 lb 12.8 oz (102.4 kg)   LMP 10/26/2018   SpO2 99%   BMI 37.87 kg/m?  ?  ? ? ? ?Physical Exam ?Vitals reviewed.  ?Constitutional:   ?   General: She is not in acute distress. ?   Appearance: She is well-developed. She is obese.  ?HENT:  ?   Right Ear: Tympanic membrane, ear canal and external ear normal.  ?   Left Ear: Tympanic membrane, ear canal and external ear normal.  ?Eyes:  ?   Extraocular Movements: Extraocular movements intact.  ?   Conjunctiva/sclera: Conjunctivae normal.  ?Cardiovascular:  ?   Rate and Rhythm: Normal rate and regular rhythm.  ?   Heart sounds: Normal heart sounds.  ?Pulmonary:  ?   Effort: Pulmonary effort is normal. No respiratory distress.  ?   Breath sounds: Normal breath sounds.  ?Chest:  ?   Chest wall: No tenderness.  ?Abdominal:  ?    General: Bowel sounds are normal.  ?   Palpations: Abdomen is soft.  ?Genitourinary: ?   Comments: Deferred breast and pelvic exam to GYN ?Musculoskeletal:     ?   General: Normal range of motion.  ?   Cervical back: Normal range of motion and neck supple.  ?   Right lower leg: No edema.  ?   Left lower leg: No edema.  ?Lymphadenopathy:  ?   Cervical: No cervical adenopathy.  ?Skin: ?   General: Skin is warm and dry.  ?Neurological:  ?   Mental  Status: She is alert and oriented to person, place, and time.  ?   Deep Tendon Reflexes: Reflexes are normal and symmetric.  ?Psychiatric:     ?   Mood and Affect: Mood normal.     ?   Behavior: Behavior normal.     ?   Thought Content: Thought content normal.  ?  ? ?No results found for any visits on 05/31/21. ?   ?Assessment & Plan:  ?  ?Routine Health Maintenance and Physical Exam ? ? ?There is no immunization history on file for this patient. ? ?Health Maintenance  ?Topic Date Due  ? PAP SMEAR-Modifier  11/23/2018  ? INFLUENZA VACCINE  06/08/2021 (Originally 10/09/2020)  ? TETANUS/TDAP  06/01/2022 (Originally 11/06/1995)  ? Hepatitis C Screening  06/01/2022 (Originally 11/06/1994)  ? HIV Screening  06/01/2022 (Originally 11/06/1991)  ? HPV VACCINES  Aged Out  ? COVID-19 Vaccine  Discontinued  ? ? ?Discussed health benefits of physical activity, and encouraged her to engage in regular exercise appropriate for her age and condition. ? ?Problem List Items Addressed This Visit   ?None ?Visit Diagnoses   ? ? Encounter for preventative adult health care exam with abnormal findings    -  Primary  ? Relevant Orders  ? Comprehensive metabolic panel  ? CBC with Differential/Platelet  ? Lipid panel  ? TSH  ? Encounter for lipid screening for cardiovascular disease      ? Class 2 obesity due to excess calories without serious comorbidity with body mass index (BMI) of 37.0 to 37.9 in adult      ? Relevant Orders  ? Lipid panel  ? TSH  ? ?  ? ?Return in about 1 year (around 06/01/2022) for  CPE (fasting). ? ?  ? ?[name], NP ?

## 2021-05-31 NOTE — Patient Instructions (Signed)
Sign medical release to get records from GYN ? ?Start regular exercise and heart healthy diet ? ?Perform monthly breast exam. ? ?How to Increase Your Level of Physical Activity ?Getting regular physical activity is important for your overall health and well-being. Most people do not get enough exercise. There are easy ways to increase your level of physical activity, even if you have not been very active in the past or if you are just starting out. ?What are the benefits of physical activity? ?Physical activity has many short-term and long-term benefits. Being active on a regular basis can improve your physical and mental health as well as provide other benefits. ?Physical health benefits ?Helping you lose weight or maintain a healthy weight. ?Strengthening your muscles and bones. ?Reducing your risk of certain long-term (chronic) diseases, including heart disease, cancer, and diabetes. ?Being able to move around more easily and for longer periods of time without getting tired (increased endurance or stamina). ?Improving your ability to fight off illness (enhanced immunity). ?Being able to sleep better. ?Helping you stay healthy as you get older, including: ?Helping you stay mobile, or capable of walking and moving around. ?Preventing accidents, such as falls. ?Increasing life expectancy. ?Mental health benefits ?Boosting your mood and improving your self-esteem. ?Lowering your chance of having mental health problems, such as depression or anxiety. ?Helping you feel good about your body. ?Other benefits ?Finding new sources of fun and enjoyment. ?Meeting new people who share a common interest. ?Before you begin ?If you have a chronic illness or have not been active for a while, check with your health care provider about how to get started. Ask your health care provider what activities are safe for you. ?Start out slowly. Walking or doing some simple chair exercises is a good place to start, especially if you have  not been active before or for a long time. ?Set goals that you can work toward. Ask your health care provider how much exercise is best for you. In general, most adults should: ?Do moderate-intensity exercise for at least 150 minutes each week (30 minutes on most days of the week) or vigorous exercise for at least 75 minutes each week, or a combination of these. ?Moderate-intensity exercise can include walking at a quick pace, biking, yoga, water aerobics, or gardening. ?Vigorous exercise involves activities that take more effort, such as jogging or running, playing sports, swimming laps, or jumping rope. ?Do strength exercises on at least 2 days each week. This can include weight lifting, body weight exercises, and resistance-band exercises. ?How to be more physically active ?Make a plan ? ?Try to find activities that you enjoy. You are more likely to commit to an exercise routine if it does not feel like a chore. ?If you have bone or joint problems, choose low-impact exercises, like walking or swimming. ?Use these tips for being successful with an exercise plan: ?Find a workout partner for accountability. ?Join a group or class, such as an aerobics class, cycling class, or sports team. ?Make family time active. Go for a walk, bike, or swim. ?Include a variety of exercises each week. ?Consider using a fitness tracker, such as a mobile phone app or a device worn like a watch, that will count the number of steps you take each day. Many people strive to reach 10,000 steps a day. ?Find ways to be active in your daily routines ?Besides your formal exercise plans, you can find ways to do physical activity during your daily routines, such as: ?Walking or  biking to work or to the store. ?Taking the stairs instead of the elevator. ?Parking farther away from the door at work or at the store. ?Planning walking meetings. ?Walking around while you are on the phone. ?Where to find more information ?Centers for Disease Control  and Prevention: WorkDashboard.es ?President's Council on [employer], Sports & Nutrition: www.fitness.gov ?ChooseMyPlate: MassVoice.es ?Contact a health care provider if: ?You have headaches, muscle aches, or joint pain that is concerning. ?You feel dizzy or light-headed while exercising. ?You faint. ?You feel your heart skipping, racing, or fluttering. ?You have chest pain while exercising. ?Summary ?Exercise benefits your mind and body at any age, even if you are just starting out. ?If you have a chronic illness or have not been active for a while, check with your health care provider before increasing your physical activity. ?Choose activities that are safe and enjoyable for you. Ask your health care provider what activities are safe for you. ?Start slowly. Tell your health care provider if you have problems as you start to increase your activity level. ?This information is not intended to replace advice given to you by your health care provider. Make sure you discuss any questions you have with your health care provider. ?Document Revised: 06/23/2020 Document Reviewed: 06/23/2020 ?Elsevier Patient Education ? [place]. ? ?Calorie Counting for Weight Loss ?Calories are units of energy. Your body needs a certain number of calories from food to keep going throughout the day. When you eat or drink more calories than your body needs, your body stores the extra calories mostly as fat. When you eat or drink fewer calories than your body needs, your body burns fat to get the energy it needs. ?Calorie counting means keeping track of how many calories you eat and drink each day. Calorie counting can be helpful if you need to lose weight. If you eat fewer calories than your body needs, you should lose weight. Ask your health care provider what a healthy weight is for you. ?For calorie counting to work, you will need to eat the right number of calories each day to lose a healthy amount of weight per  week. A dietitian can help you figure out how many calories you need in a day and will suggest ways to reach your calorie goal. ?A healthy amount of weight to lose each week is usually 1-2 lb (0.5-0.9 kg). This usually means that your daily calorie intake should be reduced by 500-750 calories. ?Eating 1,200-1,500 calories a day can help most women lose weight. ?Eating 1,500-1,800 calories a day can help most men lose weight. ?What do I need to know about calorie counting? ?Work with your health care provider or dietitian to determine how many calories you should get each day. To meet your daily calorie goal, you will need to: ?Find out how many calories are in each food that you would like to eat. Try to do this before you eat. ?Decide how much of the food you plan to eat. ?Keep a food log. Do this by writing down what you ate and how many calories it had. ?To successfully lose weight, it is important to balance calorie counting with a healthy lifestyle that includes regular activity. ?Where do I find calorie information? ?The number of calories in a food can be found on a Nutrition Facts label. If a food does not have a Nutrition Facts label, try to look up the calories online or ask your dietitian for help. ?Remember that calories are listed per  serving. If you choose to have more than one serving of a food, you will have to multiply the calories per serving by the number of servings you plan to eat. For example, the label on a package of bread might say that a serving size is 1 slice and that there are 90 calories in a serving. If you eat 1 slice, you will have eaten 90 calories. If you eat 2 slices, you will have eaten 180 calories. ?How do I keep a food log? ?After each time that you eat, record the following in your food log as soon as possible: ?What you ate. Be sure to include toppings, sauces, and other extras on the food. ?How much you ate. This can be measured in cups, ounces, or number of items. ?How  many calories were in each food and drink. ?The total number of calories in the food you ate. ?Keep your food log near you, such as in a pocket-sized notebook or on an app or website on your mobile phone. Some p

## 2021-09-05 DIAGNOSIS — Z01419 Encounter for gynecological examination (general) (routine) without abnormal findings: Secondary | ICD-10-CM | POA: Diagnosis not present

## 2021-09-05 DIAGNOSIS — Z1231 Encounter for screening mammogram for malignant neoplasm of breast: Secondary | ICD-10-CM | POA: Diagnosis not present

## 2021-09-05 DIAGNOSIS — Z6837 Body mass index (BMI) 37.0-37.9, adult: Secondary | ICD-10-CM | POA: Diagnosis not present

## 2021-09-10 ENCOUNTER — Other Ambulatory Visit: Payer: Self-pay | Admitting: Obstetrics and Gynecology

## 2021-09-10 DIAGNOSIS — R928 Other abnormal and inconclusive findings on diagnostic imaging of breast: Secondary | ICD-10-CM

## 2021-09-28 ENCOUNTER — Other Ambulatory Visit: Payer: Self-pay | Admitting: Obstetrics and Gynecology

## 2021-09-28 ENCOUNTER — Ambulatory Visit
Admission: RE | Admit: 2021-09-28 | Discharge: 2021-09-28 | Disposition: A | Discharge status: Home / Self Care | Payer: BC Managed Care – PPO | Source: Ambulatory Visit | Attending: Obstetrics and Gynecology | Admitting: Obstetrics and Gynecology

## 2021-09-28 DIAGNOSIS — N6489 Other specified disorders of breast: Secondary | ICD-10-CM | POA: Diagnosis not present

## 2021-09-28 DIAGNOSIS — R928 Other abnormal and inconclusive findings on diagnostic imaging of breast: Secondary | ICD-10-CM

## 2022-01-18 DIAGNOSIS — L723 Sebaceous cyst: Secondary | ICD-10-CM | POA: Diagnosis not present

## 2022-04-02 ENCOUNTER — Ambulatory Visit
Admission: RE | Admit: 2022-04-02 | Discharge: 2022-04-02 | Disposition: A | Discharge status: Home / Self Care | Payer: BC Managed Care – PPO | Source: Ambulatory Visit | Attending: Obstetrics and Gynecology | Admitting: Obstetrics and Gynecology

## 2022-04-02 ENCOUNTER — Other Ambulatory Visit: Payer: Self-pay | Admitting: Obstetrics and Gynecology

## 2022-04-02 ENCOUNTER — Ambulatory Visit: Payer: BC Managed Care – PPO

## 2022-04-02 DIAGNOSIS — N6489 Other specified disorders of breast: Secondary | ICD-10-CM

## 2022-04-02 DIAGNOSIS — R922 Inconclusive mammogram: Secondary | ICD-10-CM | POA: Diagnosis not present

## 2022-06-05 IMAGING — MG MM DIGITAL DIAGNOSTIC UNILAT*L* W/ TOMO W/ CAD
4 series · 4 of 12 positions shown · non-contrast
Comparison: Previous exam(s).

CLINICAL DATA: 44-year-old female presenting for follow-up for a
left breast asymmetry. She was scheduled for a stereotactic biopsy
however the abnormality was not seen at the time of the procedure.

EXAM:
DIGITAL DIAGNOSTIC UNILATERAL LEFT MAMMOGRAM WITH TOMO AND CAD

[L CC synth-2D]
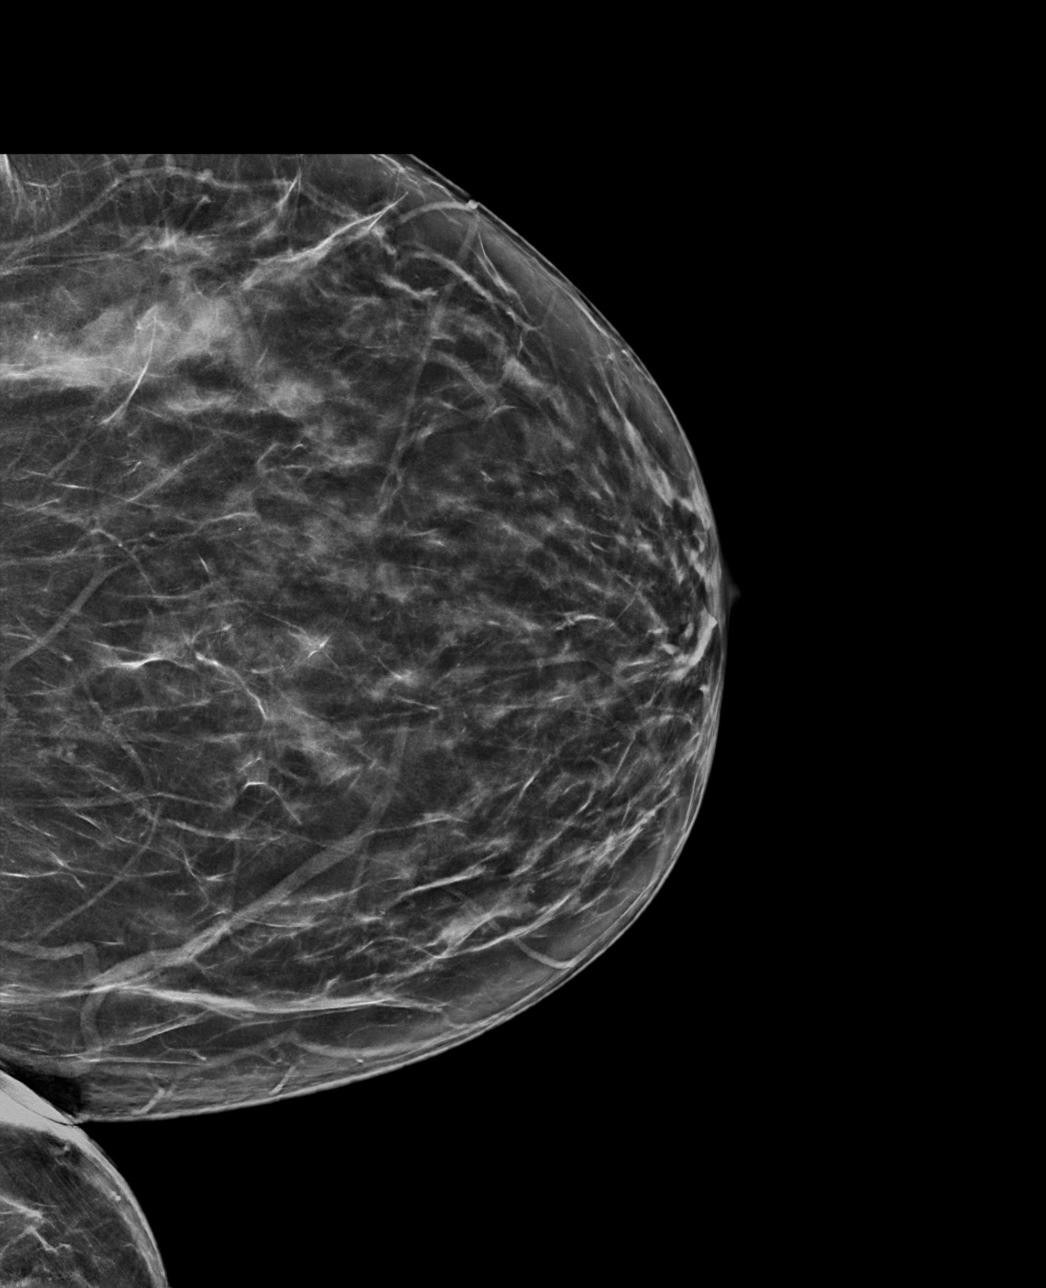

[L MLO synth-2D]
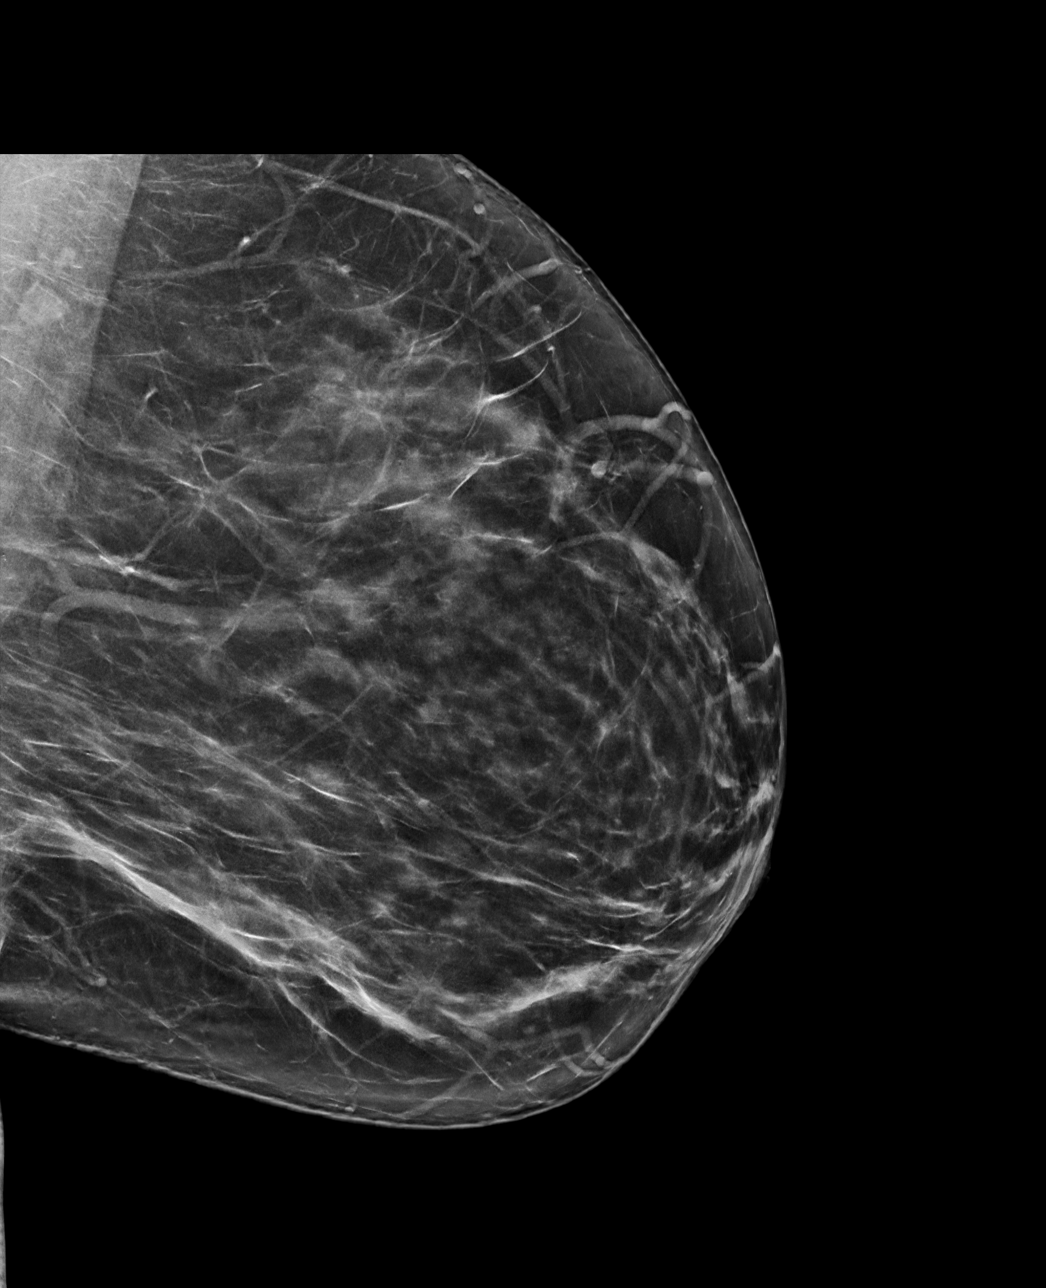

[L CC tomo · tomo slice 37/74.0]
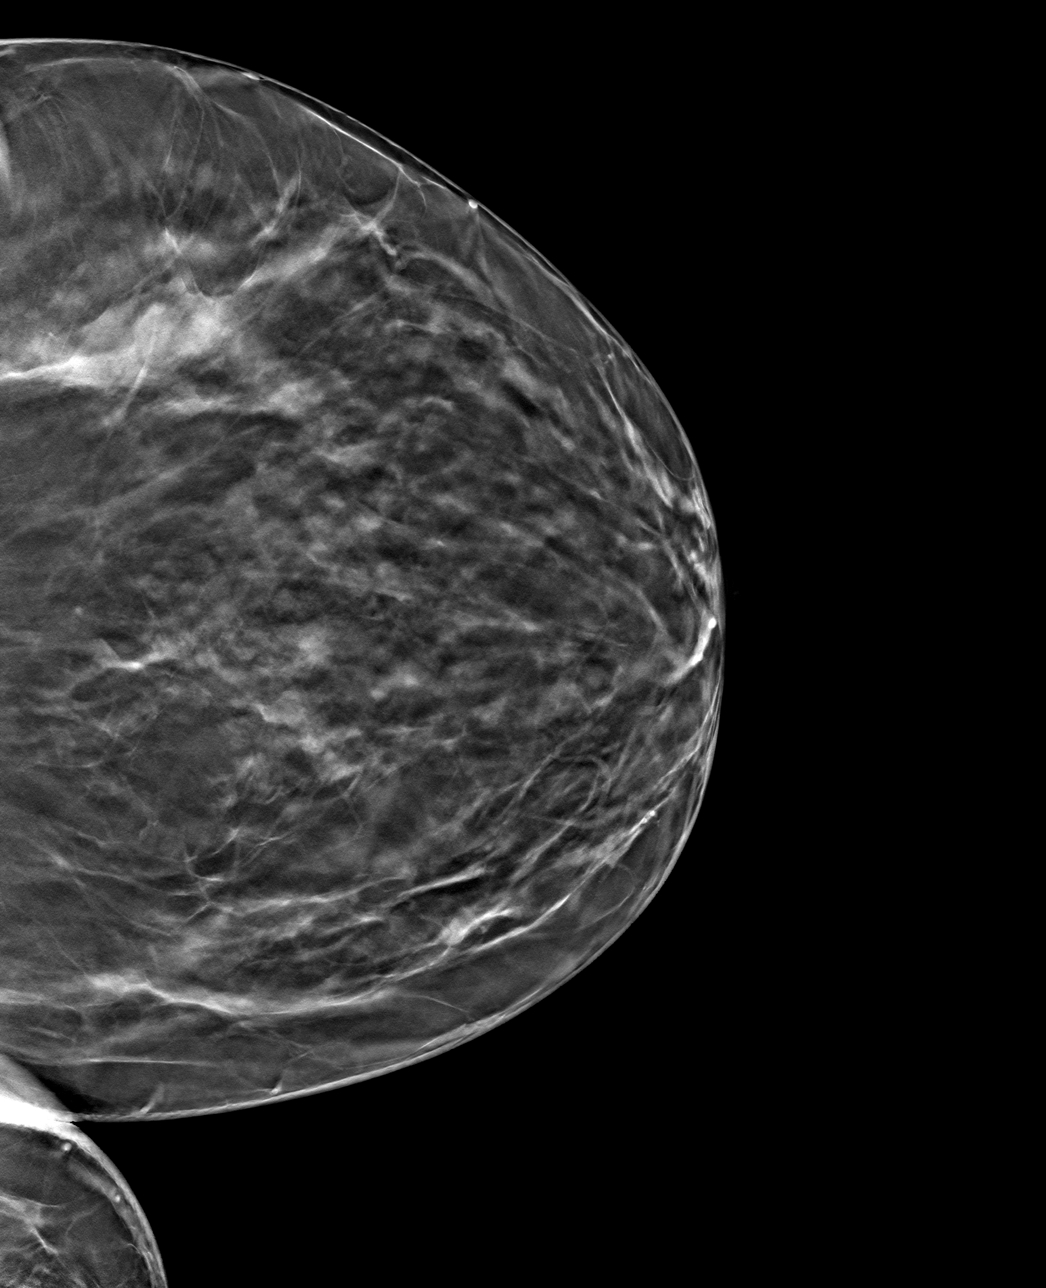

[L MLO tomo · tomo slice 43/84.0]
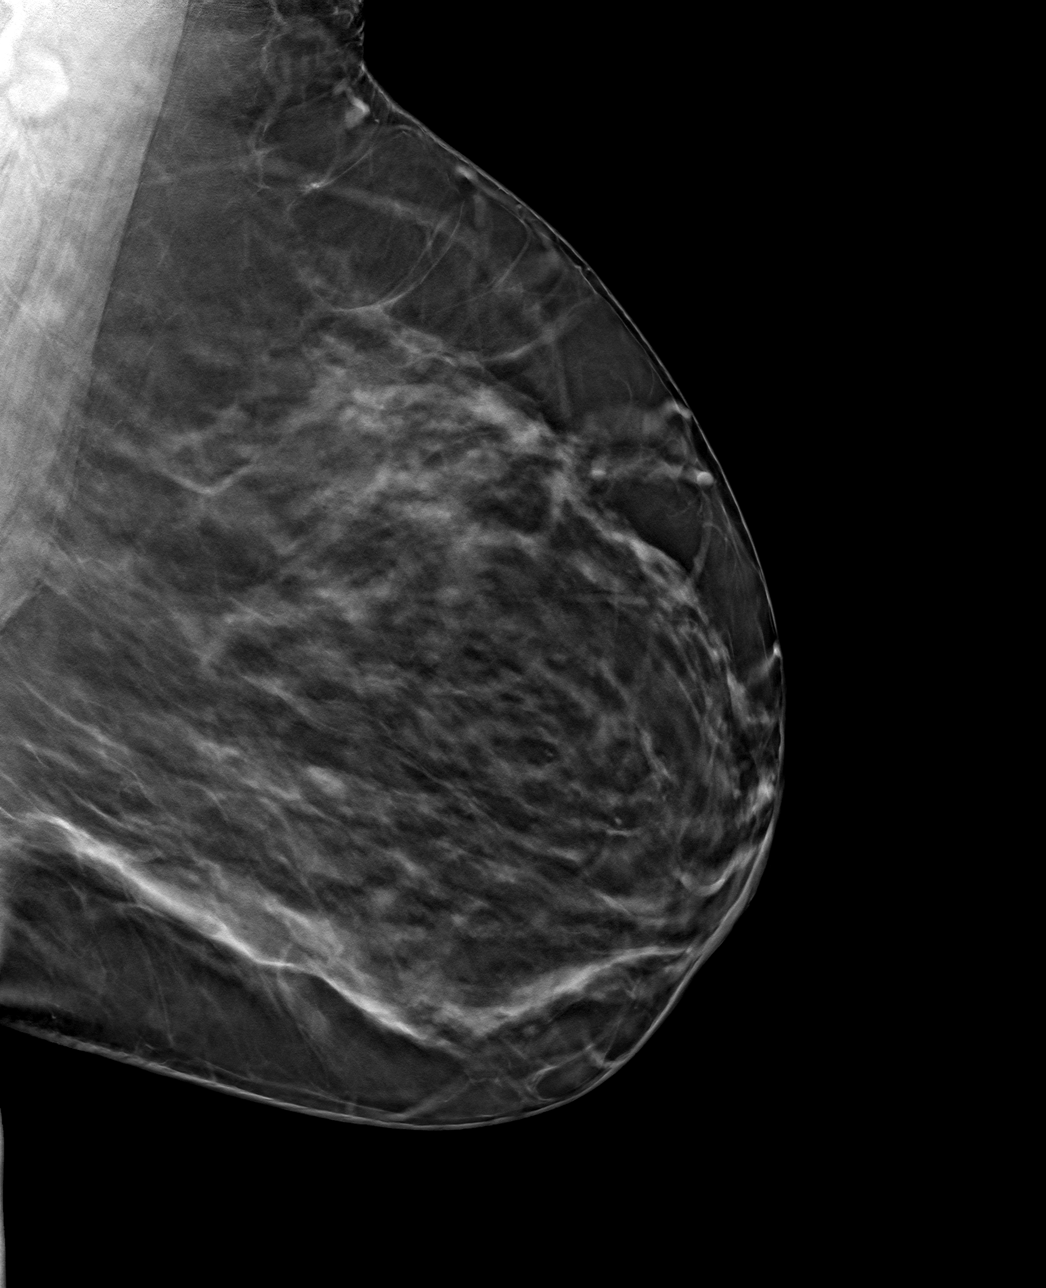

[4 of 12 positions shown; findings below may reference images not displayed]

ACR Breast Density Category c: The breast tissue is heterogeneously
dense, which may obscure small masses.
FINDINGS: The asymmetry previously seen in the superior central left breast is
not reproduced on the current mammogram. No suspicious
calcifications, masses or areas of distortion are seen in the left
breast.

Mammographic images were processed with CAD.
IMPRESSION: Resolution of the left breast asymmetry. This likely represented
overlapping fibroglandular tissues on the prior exam.

RECOMMENDATION:
Return to routine screening mammography is recommended. The patient
will be due for screening in Thursday July, 2020.

I have discussed the findings and recommendations with the patient.
If applicable, a reminder letter will be sent to the patient
regarding the next appointment.

BI-RADS CATEGORY  1: Negative.

## 2022-09-09 ENCOUNTER — Ambulatory Visit
Admission: RE | Admit: 2022-09-09 | Discharge: 2022-09-09 | Disposition: A | Discharge status: Home / Self Care | Payer: BC Managed Care – PPO | Source: Ambulatory Visit | Attending: Obstetrics and Gynecology | Admitting: Obstetrics and Gynecology

## 2022-09-09 DIAGNOSIS — N6489 Other specified disorders of breast: Secondary | ICD-10-CM

## 2022-09-09 DIAGNOSIS — R928 Other abnormal and inconclusive findings on diagnostic imaging of breast: Secondary | ICD-10-CM | POA: Diagnosis not present

## 2022-10-02 DIAGNOSIS — Z1272 Encounter for screening for malignant neoplasm of vagina: Secondary | ICD-10-CM | POA: Diagnosis not present

## 2022-10-02 DIAGNOSIS — Z01419 Encounter for gynecological examination (general) (routine) without abnormal findings: Secondary | ICD-10-CM | POA: Diagnosis not present

## 2022-10-02 DIAGNOSIS — Z1151 Encounter for screening for human papillomavirus (HPV): Secondary | ICD-10-CM | POA: Diagnosis not present

## 2022-10-02 DIAGNOSIS — Z6838 Body mass index (BMI) 38.0-38.9, adult: Secondary | ICD-10-CM | POA: Diagnosis not present

## 2022-10-02 DIAGNOSIS — R61 Generalized hyperhidrosis: Secondary | ICD-10-CM | POA: Diagnosis not present

## 2023-02-03 DIAGNOSIS — N951 Menopausal and female climacteric states: Secondary | ICD-10-CM | POA: Diagnosis not present

## 2023-02-03 DIAGNOSIS — R232 Flushing: Secondary | ICD-10-CM | POA: Diagnosis not present

## 2023-04-04 ENCOUNTER — Ambulatory Visit: Payer: BC Managed Care – PPO | Admitting: Nurse Practitioner

## 2023-04-04 ENCOUNTER — Encounter: Payer: Self-pay | Admitting: Nurse Practitioner

## 2023-04-04 VITALS — BP 160/110 | HR 85 | Temp 98.5°F | Ht 64.75 in | Wt 223.4 lb

## 2023-04-04 DIAGNOSIS — I10 Essential (primary) hypertension: Secondary | ICD-10-CM

## 2023-04-04 HISTORY — DX: Essential (primary) hypertension: I10

## 2023-04-04 LAB — COMPREHENSIVE METABOLIC PANEL
ALT: 38 U/L — ABNORMAL HIGH (ref 0–35)
AST: 35 U/L (ref 0–37)
Albumin: 4.5 g/dL (ref 3.5–5.2)
Alkaline Phosphatase: 66 U/L (ref 39–117)
BUN: 10 mg/dL (ref 6–23)
CO2: 25 meq/L (ref 19–32)
Calcium: 9.7 mg/dL (ref 8.4–10.5)
Chloride: 105 meq/L (ref 96–112)
Creatinine, Ser: 0.83 mg/dL (ref 0.40–1.20)
GFR: 84.55 mL/min (ref >60.00)
Glucose, Bld: 90 mg/dL (ref 70–99)
Potassium: 4.1 meq/L (ref 3.5–5.1)
Sodium: 139 meq/L (ref 135–145)
Total Bilirubin: 0.3 mg/dL (ref 0.2–1.2)
Total Protein: 8.1 g/dL (ref 6.0–8.3)

## 2023-04-04 LAB — CBC
HCT: 42.7 % (ref 36.0–46.0)
Hemoglobin: 13.8 g/dL (ref 12.0–15.0)
MCHC: 32.4 g/dL (ref 30.0–36.0)
MCV: 84.8 fL (ref 78.0–100.0)
Platelets: 265 10*3/uL (ref 150.0–400.0)
RBC: 5.04 Mil/uL (ref 3.87–5.11)
RDW: 13.5 % (ref 11.5–15.5)
WBC: 5.4 10*3/uL (ref 4.0–10.5)

## 2023-04-04 LAB — TSH: TSH: 1.77 u[IU]/mL (ref 0.35–5.50)

## 2023-04-04 NOTE — Patient Instructions (Signed)
Go to lab Maintain DASH Start amlodipine 5mg  in PM Monitor BP at home with a upper arm cuff in AM 3x/week Bring BP readings to next appt  DASH Eating Plan DASH stands for Dietary Approaches to Stop Hypertension. The DASH eating plan is a healthy eating plan that has been shown to: Lower high blood pressure (hypertension). Reduce your risk for type 2 diabetes, heart disease, and stroke. Help with weight loss. What are tips for following this plan? Reading food labels Check food labels for the amount of salt (sodium) per serving. Choose foods with less than 5 percent of the Daily Value (DV) of sodium. In general, foods with less than 300 milligrams (mg) of sodium per serving fit into this eating plan. To find whole grains, look for the word "whole" as the first word in the ingredient list. Shopping Buy products labeled as "low-sodium" or "no salt added." Buy fresh foods. Avoid canned foods and pre-made or frozen meals. Cooking Try not to add salt when you cook. Use salt-free seasonings or herbs instead of table salt or sea salt. Check with your health care provider or pharmacist before using salt substitutes. Do not fry foods. Cook foods in healthy ways, such as baking, boiling, grilling, roasting, or broiling. Cook using oils that are good for your heart. These include olive, canola, avocado, soybean, and sunflower oil. Meal planning  Eat a balanced diet. This should include: 4 or more servings of fruits and 4 or more servings of vegetables each day. Try to fill half of your plate with fruits and vegetables. 6-8 servings of whole grains each day. 6 or less servings of lean meat, poultry, or fish each day. 1 oz is 1 serving. A 3 oz (85 g) serving of meat is about the same size as the palm of your hand. One egg is 1 oz (28 g). 2-3 servings of low-fat dairy each day. One serving is 1 cup (237 mL). 1 serving of nuts, seeds, or beans 5 times each week. 2-3 servings of heart-healthy fats.  Healthy fats called omega-3 fatty acids are found in foods such as walnuts, flaxseeds, fortified milks, and eggs. These fats are also found in cold-water fish, such as sardines, salmon, and mackerel. Limit how much you eat of: Canned or prepackaged foods. Food that is high in trans fat, such as fried foods. Food that is high in saturated fat, such as fatty meat. Desserts and other sweets, sugary drinks, and other foods with added sugar. Full-fat dairy products. Do not salt foods before eating. Do not eat more than 4 egg yolks a week. Try to eat at least 2 vegetarian meals a week. Eat more home-cooked food and less restaurant, buffet, and fast food. Lifestyle When eating at a restaurant, ask if your food can be made with less salt or no salt. If you drink alcohol: Limit how much you have to: 0-1 drink a day if you are female. 0-2 drinks a day if you are female. Know how much alcohol is in your drink. In the [place] one drink is one 12 oz bottle of beer (355 mL), one 5 oz glass of wine (148 mL), or one 1 oz glass of hard liquor (44 mL). General information Avoid eating more than 2,300 mg of salt a day. If you have hypertension, you may need to reduce your sodium intake to 1,500 mg a day. Work with your provider to stay at a healthy body weight or lose weight. Ask what the best weight range  is for you. On most days of the week, get at least 30 minutes of exercise that causes your heart to beat faster. This may include walking, swimming, or biking. Work with your provider or dietitian to adjust your eating plan to meet your specific calorie needs. What foods should I eat? Fruits All fresh, dried, or frozen fruit. Canned fruits that are in their natural juice and do not have sugar added to them. Vegetables Fresh or frozen vegetables that are raw, steamed, roasted, or grilled. Low-sodium or reduced-sodium tomato and vegetable juice. Low-sodium or reduced-sodium tomato sauce and tomato paste.  Low-sodium or reduced-sodium canned vegetables. Grains Whole-grain or whole-wheat bread. Whole-grain or whole-wheat pasta. Brown rice. Robin Townsend. Bulgur. Whole-grain and low-sodium cereals. Pita bread. Low-fat, low-sodium crackers. Whole-wheat flour tortillas. Meats and other proteins Skinless chicken or [place]. Ground chicken or [place]. Pork with fat trimmed off. Fish and seafood. Egg whites. Dried beans, peas, or lentils. Unsalted nuts, nut butters, and seeds. Unsalted canned beans. Lean cuts of beef with fat trimmed off. Low-sodium, lean precooked or cured meat, such as sausages or meat loaves. Dairy Low-fat (1%) or fat-free (skim) milk. Reduced-fat, low-fat, or fat-free cheeses. Nonfat, low-sodium ricotta or cottage cheese. Low-fat or nonfat yogurt. Low-fat, low-sodium cheese. Fats and oils Soft margarine without trans fats. Vegetable oil. Reduced-fat, low-fat, or light mayonnaise and salad dressings (reduced-sodium). Canola, safflower, olive, avocado, soybean, and sunflower oils. Avocado. Seasonings and condiments Herbs. Spices. Seasoning mixes without salt. Other foods Unsalted popcorn and pretzels. Fat-free sweets. The items listed above may not be all the foods and drinks you can have. Talk to a dietitian to learn more. What foods should I avoid? Fruits Canned fruit in a light or heavy syrup. Fried fruit. Fruit in cream or butter sauce. Vegetables Creamed or fried vegetables. Vegetables in a cheese sauce. Regular canned vegetables that are not marked as low-sodium or reduced-sodium. Regular canned tomato sauce and paste that are not marked as low-sodium or reduced-sodium. Regular tomato and vegetable juices that are not marked as low-sodium or reduced-sodium. Robin Townsend. Olives. Grains Baked goods made with fat, such as croissants, muffins, or some breads. Dry pasta or rice meal packs. Meats and other proteins Fatty cuts of meat. Ribs. Fried meat. Robin Townsend. Bologna, salami, and other  precooked or cured meats, such as sausages or meat loaves, that are not lean and low in sodium. Fat from the back of a pig (fatback). Bratwurst. Salted nuts and seeds. Canned beans with added salt. Canned or smoked fish. Whole eggs or egg yolks. Chicken or [place] with skin. Dairy Whole or 2% milk, cream, and half-and-half. Whole or full-fat cream cheese. Whole-fat or sweetened yogurt. Full-fat cheese. Nondairy creamers. Whipped toppings. Processed cheese and cheese spreads. Fats and oils Butter. Stick margarine. Lard. Shortening. Ghee. Bacon fat. Tropical oils, such as coconut, palm kernel, or palm oil. Seasonings and condiments Onion salt, garlic salt, seasoned salt, table salt, and sea salt. Worcestershire sauce. Tartar sauce. Barbecue sauce. Teriyaki sauce. Soy sauce, including reduced-sodium soy sauce. Steak sauce. Canned and packaged gravies. Fish sauce. Oyster sauce. Cocktail sauce. Store-bought horseradish. Ketchup. Mustard. Meat flavorings and tenderizers. Bouillon cubes. Hot sauces. Pre-made or packaged marinades. Pre-made or packaged taco seasonings. Relishes. Regular salad dressings. Other foods Salted popcorn and pretzels. The items listed above may not be all the foods and drinks you should avoid. Talk to a dietitian to learn more. Where to find more information National Heart, Lung, and Blood Institute (NHLBI): BuffaloDryCleaner.gl American Heart Association (AHA): heart.org  Academy of Nutrition and Dietetics: eatright.org National Kidney Foundation (NKF): kidney.org This information is not intended to replace advice given to you by your health care provider. Make sure you discuss any questions you have with your health care provider. Document Revised: 03/14/2022 Document Reviewed: 03/14/2022 Elsevier Patient Education  2024 [employer].

## 2023-04-04 NOTE — Assessment & Plan Note (Signed)
Reports Elevated BP and headache x 2weeks No new meds, no change in sleep, no additional stressor No headache today, but has dizziness when she lays on right side and Intermittent chest pressure, last a few minutes and resolves spontaneously. Home BP reading:173/128, 193/136. Fhx of HYPERTENSION: mother and father. No tobacco or ALCOHOL use  BP Readings from Last 3 Encounters:  04/04/23 (!) 160/110  05/31/21 126/86  11/18/18 134/81    ECG obtained today: NSR with poor r-wave progression, normal ST segment and T wave. No previous ECG to compare. Check CBC, CMP, THYROID, and urine protein today Start amlodipine 5mg  in PM Advised to maintain DASH diet and continue BP check at home F/up in 10month.

## 2023-04-04 NOTE — Progress Notes (Unsigned)
Established Patient Visit  Patient: Robin Townsend   DOB: Jul 08, 1975   48 y.o. Female  MRN: [id number] Visit Date: 04/08/2023  Subjective:    Chief Complaint  Patient presents with   Elevated Blood Pressure and Headaches    Twice in past two weeks with headache and elevated blood pressure   HPI HTN (hypertension), benign Reports Elevated BP and headache x 2weeks No new meds, no change in sleep, no additional stressor No headache today, but has dizziness when she lays on right side and Intermittent chest pressure, last a few minutes and resolves spontaneously. Home BP reading:173/128, 193/136. Fhx of HYPERTENSION: mother and father. No tobacco or ALCOHOL use  BP Readings from Last 3 Encounters:  04/04/23 (!) 160/110  05/31/21 126/86  11/18/18 134/81    ECG obtained today: NSR with poor r-wave progression, normal ST segment and T wave. No previous ECG to compare. Check CBC, CMP, THYROID, and urine protein today Start amlodipine 5mg  in PM Advised to maintain DASH diet and continue BP check at home F/up in 47month.  Reviewed medical, surgical, and social history today  Medications: Outpatient Medications Prior to Visit  Medication Sig   [DISCONTINUED] amLODipine (NORVASC) 5 MG tablet Take 5 mg by mouth daily.   No facility-administered medications prior to visit.   Reviewed past medical and social history.   ROS per HPI above      Objective:  BP (!) 160/110 (BP Location: Left Arm, Patient Position: Sitting, Cuff Size: Normal)   Pulse 85   Temp 98.5 F (36.9 C)   Ht 5' 4.75" (1.645 m)   Wt 223 lb 6.4 oz (101.3 kg)   LMP 10/26/2018   SpO2 100%   BMI 37.46 kg/m      Physical Exam Vitals and nursing note reviewed.  Cardiovascular:     Rate and Rhythm: Normal rate and regular rhythm.     Pulses: Normal pulses.     Heart sounds: Normal heart sounds.  Pulmonary:     Effort: Pulmonary effort is normal.     Breath sounds: Normal breath sounds.   Musculoskeletal:     Right lower leg: No edema.     Left lower leg: No edema.  Neurological:     Mental Status: She is alert and oriented to person, place, and time.  Psychiatric:        Mood and Affect: Mood normal.        Behavior: Behavior normal.        Thought Content: Thought content normal.     Results for orders placed or performed in visit on 04/04/23  TSH  Result Value Ref Range   TSH 1.77 0.35 - 5.50 uIU/mL  Comprehensive metabolic panel  Result Value Ref Range   Sodium 139 135 - 145 mEq/L   Potassium 4.1 3.5 - 5.1 mEq/L   Chloride 105 96 - 112 mEq/L   CO2 25 19 - 32 mEq/L   Glucose, Bld 90 70 - 99 mg/dL   BUN 10 6 - 23 mg/dL   Creatinine, Ser 4.09 0.40 - 1.20 mg/dL   Total Bilirubin 0.3 0.2 - 1.2 mg/dL   Alkaline Phosphatase 66 39 - 117 U/L   AST 35 0 - 37 U/L   ALT 38 (H) 0 - 35 U/L   Total Protein 8.1 6.0 - 8.3 g/dL   Albumin 4.5 3.5 - 5.2 g/dL   GFR 81.19 >14.78 mL/min  Calcium 9.7 8.4 - 10.5 mg/dL  CBC  Result Value Ref Range   WBC 5.4 4.0 - 10.5 K/uL   RBC 5.04 3.87 - 5.11 Mil/uL   Platelets 265.0 150.0 - 400.0 K/uL   Hemoglobin 13.8 12.0 - 15.0 g/dL   HCT 78.2 95.6 - 21.3 %   MCV 84.8 78.0 - 100.0 fl   MCHC 32.4 30.0 - 36.0 g/dL   RDW 08.6 57.8 - 46.9 %  Protein / creatinine ratio, urine  Result Value Ref Range   Creatinine, Urine 40 20 - 275 mg/dL   Protein/Creat Ratio 629 24 - 184 mg/g creat   Protein/Creatinine Ratio 0.100 0.024 - 0.184 mg/mg creat   Total Protein, Urine 4 (L) 5 - 24 mg/dL      Assessment & Plan:    Problem List Items Addressed This Visit     HTN (hypertension), benign - Primary   Reports Elevated BP and headache x 2weeks No new meds, no change in sleep, no additional stressor No headache today, but has dizziness when she lays on right side and Intermittent chest pressure, last a few minutes and resolves spontaneously. Home BP reading:173/128, 193/136. Fhx of HYPERTENSION: mother and father. No tobacco or ALCOHOL  use  BP Readings from Last 3 Encounters:  04/04/23 (!) 160/110  05/31/21 126/86  11/18/18 134/81    ECG obtained today: NSR with poor r-wave progression, normal ST segment and T wave. No previous ECG to compare. Check CBC, CMP, THYROID, and urine protein today Start amlodipine 5mg  in PM Advised to maintain DASH diet and continue BP check at home F/up in 47month.      Relevant Orders   EKG 12-Lead (Completed)   TSH (Completed)   Comprehensive metabolic panel (Completed)   CBC (Completed)   Protein / creatinine ratio, urine (Completed)   Return in about 4 weeks (around 05/02/2023) for HTN.     Alysia Penna, NP

## 2023-04-05 LAB — PROTEIN / CREATININE RATIO, URINE
Creatinine, Urine: 40 mg/dL (ref 20–275)
Protein/Creat Ratio: 100 mg/g{creat} (ref 24–184)
Protein/Creatinine Ratio: 0.1 mg/mg{creat} (ref 0.024–0.184)
Total Protein, Urine: 4 mg/dL — ABNORMAL LOW (ref 5–24)

## 2023-04-07 ENCOUNTER — Telehealth: Payer: Self-pay

## 2023-04-07 NOTE — Telephone Encounter (Signed)
Called patient no answer left VM for call back and sent message through MyChart

## 2023-04-08 ENCOUNTER — Other Ambulatory Visit: Payer: Self-pay

## 2023-04-08 ENCOUNTER — Encounter: Payer: Self-pay | Admitting: Nurse Practitioner

## 2023-04-08 MED ORDER — AMLODIPINE BESYLATE 5 MG PO TABS: 5.0000 mg | ORAL_TABLET | Freq: Every evening | ORAL | 5 refills | Status: DC

## 2023-04-08 MED ORDER — AMLODIPINE BESYLATE 5 MG PO TABS: 5.0000 mg | ORAL_TABLET | Freq: Every day | ORAL | 0 refills | Status: DC

## 2023-04-09 ENCOUNTER — Ambulatory Visit: Payer: BC Managed Care – PPO | Admitting: Nurse Practitioner

## 2023-04-09 ENCOUNTER — Encounter: Payer: Self-pay | Admitting: Nurse Practitioner

## 2023-04-09 VITALS — BP 122/84 | HR 79 | Temp 99.9°F | Wt 218.2 lb

## 2023-04-09 DIAGNOSIS — J069 Acute upper respiratory infection, unspecified: Secondary | ICD-10-CM | POA: Diagnosis not present

## 2023-04-09 DIAGNOSIS — I10 Essential (primary) hypertension: Secondary | ICD-10-CM

## 2023-04-09 LAB — POCT INFLUENZA A/B
Influenza A, POC: NEGATIVE
Influenza B, POC: NEGATIVE

## 2023-04-09 LAB — POC COVID19 BINAXNOW: SARS Coronavirus 2 Ag: NEGATIVE

## 2023-04-09 NOTE — Patient Instructions (Addendum)
Cancel 05/02/23 appointment Do not start amlodipine Monitor BP at home daily. Start amlodipine only BP >140/80. Maintain DASH diet  Encourage adequate oral hydration. Avoid decongestants if you have high blood pressure.  Ok to use Coricidin HBP for chest and sinus congestion. Use mucinex DM or Robitussin  or delsym for cough without sinus congestion  You can use plain "Tylenol" for fever, chills and achyness. Use cool mist humidifier at bedtime to help with nasal congestion and cough.  Cold/cough medications may have tylenol or ibuprofen or guaifenesin or dextromethophan in them, so be careful not to take beyond the recommended dose for each of these medications.   "Common cold" symptoms are usually triggered by a virus.  The antibiotics are usually not necessary. On average, a" viral cold" illness may take 7-10 days to resolve. Please, make an appointment if you are not better or if you're worse.

## 2023-04-09 NOTE — Progress Notes (Signed)
Established Patient Visit  Patient: Robin Townsend   DOB: 1975-10-27   48 y.o. Female  MRN: [id number] Visit Date: 04/09/2023  Subjective:    Chief Complaint  Patient presents with   Cough    coughing, body aches, fever, sore throat, headaches x 04/04/2023   Cough This is a new problem. The current episode started in the past 7 days. The problem has been gradually improving. The problem occurs constantly. The cough is Productive of sputum. Associated symptoms include chills, headaches, myalgias, nasal congestion, postnasal drip, rhinorrhea and a sore throat. Pertinent negatives include no chest pain, ear congestion, ear pain, fever, heartburn, hemoptysis, rash, shortness of breath, sweats, weight loss or wheezing. The symptoms are aggravated by lying down. She has tried OTC cough suppressant for the symptoms. The treatment provided moderate relief. There is no history of asthma, bronchiectasis, bronchitis, COPD, emphysema, environmental allergies or pneumonia.   HTN (hypertension), benign Improve BP without use of amlodipine BP Readings from Last 3 Encounters:  04/09/23 122/84  04/04/23 (!) 160/110  05/31/21 126/86    Advised to Do not start amlodipine Monitor BP at home daily. Start amlodipine only BP >140/80. Maintain DASH diet F/up in 3months  Reviewed medical, surgical, and social history today  Medications: Outpatient Medications Prior to Visit  Medication Sig   amLODipine (NORVASC) 5 MG tablet Take 1 tablet (5 mg total) by mouth every evening.   No facility-administered medications prior to visit.   Reviewed past medical and social history.   ROS per HPI above      Objective:  BP 122/84   Pulse 79   Temp 99.9 F (37.7 C) (Oral)   Wt 218 lb 3.2 oz (99 kg)   LMP 10/26/2018   SpO2 98%   BMI 36.59 kg/m      Physical Exam Vitals and nursing note reviewed.  Constitutional:      General: She is not in acute distress. HENT:     Right Ear:  Tympanic membrane, ear canal and external ear normal.     Left Ear: Tympanic membrane, ear canal and external ear normal.     Nose: Congestion and rhinorrhea present. No nasal tenderness or mucosal edema.     Right Nostril: No occlusion.     Left Nostril: No occlusion.     Right Turbinates: Not enlarged, swollen or pale.     Left Turbinates: Not enlarged, swollen or pale.     Right Sinus: No maxillary sinus tenderness or frontal sinus tenderness.     Left Sinus: No maxillary sinus tenderness or frontal sinus tenderness.     Mouth/Throat:     Pharynx: Oropharynx is clear. Uvula midline. Posterior oropharyngeal erythema and postnasal drip present.     Tonsils: No tonsillar exudate or tonsillar abscesses.  Eyes:     Extraocular Movements: Extraocular movements intact.     Conjunctiva/sclera: Conjunctivae normal.  Cardiovascular:     Rate and Rhythm: Normal rate and regular rhythm.     Pulses: Normal pulses.     Heart sounds: Normal heart sounds.  Pulmonary:     Effort: Pulmonary effort is normal.     Breath sounds: Normal breath sounds.  Musculoskeletal:     Cervical back: Normal range of motion and neck supple.  Lymphadenopathy:     Cervical: No cervical adenopathy.  Neurological:     Mental Status: She is alert and oriented to person, place, and time.  Results for orders placed or performed in visit on 04/09/23  POC COVID-19 BinaxNow  Result Value Ref Range   SARS Coronavirus 2 Ag Negative Negative  POCT Influenza A/B  Result Value Ref Range   Influenza A, POC Negative Negative   Influenza B, POC Negative Negative      Assessment & Plan:    Problem List Items Addressed This Visit     HTN (hypertension), benign   Improve BP without use of amlodipine BP Readings from Last 3 Encounters:  04/09/23 122/84  04/04/23 (!) 160/110  05/31/21 126/86    Advised to Do not start amlodipine Monitor BP at home daily. Start amlodipine only BP >140/80. Maintain DASH diet F/up  in 3months      Other Visit Diagnoses       Viral upper respiratory tract infection    -  Primary   Relevant Orders   POC COVID-19 BinaxNow (Completed)   POCT Influenza A/B (Completed)     Encourage adequate oral hydration. Avoid decongestants if you have high blood pressure.  Ok to use Coricidin HBP for chest and sinus congestion. Use mucinex DM or Robitussin  or delsym for cough without sinus congestion  You can use plain "Tylenol" for fever, chills and achyness. Use cool mist humidifier at bedtime to help with nasal congestion and cough. Cold/cough medications may have tylenol or ibuprofen or guaifenesin or dextromethophan in them, so be careful not to take beyond the recommended dose for each of these medications.  "Common cold" symptoms are usually triggered by a virus.  The antibiotics are usually not necessary. On average, a" viral cold" illness may take 7-10 days to resolve. Please, make an appointment if you are not better or if you're worse.   Return in about 3 months (around 07/08/2023) for HTN.     Alysia Penna, NP

## 2023-04-09 NOTE — Assessment & Plan Note (Addendum)
Improve BP without use of amlodipine BP Readings from Last 3 Encounters:  04/09/23 122/84  04/04/23 (!) 160/110  05/31/21 126/86    Advised to Do not start amlodipine Monitor BP at home daily. Start amlodipine only BP >140/80. Maintain DASH diet F/up in 3months

## 2023-04-21 DIAGNOSIS — L719 Rosacea, unspecified: Secondary | ICD-10-CM | POA: Diagnosis not present

## 2023-04-21 DIAGNOSIS — L723 Sebaceous cyst: Secondary | ICD-10-CM | POA: Diagnosis not present

## 2023-05-02 ENCOUNTER — Ambulatory Visit: Payer: BC Managed Care – PPO | Admitting: Nurse Practitioner

## 2023-05-06 DIAGNOSIS — N951 Menopausal and female climacteric states: Secondary | ICD-10-CM | POA: Diagnosis not present

## 2023-05-26 DIAGNOSIS — Z01419 Encounter for gynecological examination (general) (routine) without abnormal findings: Secondary | ICD-10-CM | POA: Diagnosis not present

## 2023-05-29 ENCOUNTER — Other Ambulatory Visit: Payer: Self-pay | Admitting: Nurse Practitioner

## 2023-05-29 DIAGNOSIS — I10 Essential (primary) hypertension: Secondary | ICD-10-CM

## 2023-07-10 ENCOUNTER — Encounter (INDEPENDENT_AMBULATORY_CARE_PROVIDER_SITE_OTHER): Payer: Self-pay

## 2023-07-10 ENCOUNTER — Ambulatory Visit: Payer: BC Managed Care – PPO | Admitting: Nurse Practitioner

## 2023-07-10 ENCOUNTER — Encounter: Payer: Self-pay | Admitting: Nurse Practitioner

## 2023-07-10 VITALS — BP 124/82 | HR 84 | Temp 97.9°F | Ht 65.0 in | Wt 221.1 lb

## 2023-07-10 DIAGNOSIS — E66812 Obesity, class 2: Secondary | ICD-10-CM | POA: Diagnosis not present

## 2023-07-10 DIAGNOSIS — Z1211 Encounter for screening for malignant neoplasm of colon: Secondary | ICD-10-CM

## 2023-07-10 DIAGNOSIS — I10 Essential (primary) hypertension: Secondary | ICD-10-CM

## 2023-07-10 DIAGNOSIS — Z6836 Body mass index (BMI) 36.0-36.9, adult: Secondary | ICD-10-CM

## 2023-07-10 DIAGNOSIS — R739 Hyperglycemia, unspecified: Secondary | ICD-10-CM

## 2023-07-10 NOTE — Patient Instructions (Signed)
 Send home BP readings via mychart

## 2023-07-10 NOTE — Progress Notes (Unsigned)
 Established Patient Visit  Patient: Robin Townsend   DOB: 1976-03-01   48 y.o. Female  MRN: [id number] Visit Date: 07/12/2023  Subjective:    Chief Complaint  Patient presents with   Follow-up    3 month follow up HTN  Would like to discuss colonoscopy    HPI HTN (hypertension), benign BP remains at goal without use of amlodipine   BP Readings from Last 3 Encounters:  07/10/23 124/82  04/09/23 122/84  04/04/23 (!) 160/110    Advised to sent home BP readings via mychart in 1-2weeks F/up in 6months  Class 2 severe obesity due to excess calories with serious comorbidity and body mass index (BMI) of 36.0 to 36.9 in adult [facility]) Encouraged to maintain a heart healthy diet and daily exerise She requested referral to weight management clinic Wt Readings from Last 3 Encounters:  07/10/23 221 lb 1.6 oz (100.3 kg)  04/09/23 218 lb 3.2 oz (99 kg)  04/04/23 223 lb 6.4 oz (101.3 kg)     Wt Readings from Last 3 Encounters:  07/10/23 221 lb 1.6 oz (100.3 kg)  04/09/23 218 lb 3.2 oz (99 kg)  04/04/23 223 lb 6.4 oz (101.3 kg)    Reviewed medical, surgical, and social history today  Medications: Outpatient Medications Prior to Visit  Medication Sig   [DISCONTINUED] amLODipine  (NORVASC ) 5 MG tablet TAKE 1 TABLET BY MOUTH EVERY DAY IN THE EVENING (Patient not taking: Reported on 07/10/2023)   No facility-administered medications prior to visit.   Reviewed past medical and social history.   ROS per HPI above  Last metabolic panel Lab Results  Component Value Date   GLUCOSE 90 04/04/2023   NA 139 04/04/2023   K 4.1 04/04/2023   CL 105 04/04/2023   CO2 25 04/04/2023   BUN 10 04/04/2023   CREATININE 0.83 04/04/2023   GFR 84.55 04/04/2023   CALCIUM 9.7 04/04/2023   PROT 8.1 04/04/2023   ALBUMIN 4.5 04/04/2023   BILITOT 0.3 04/04/2023   ALKPHOS 66 04/04/2023   AST 35 04/04/2023   ALT 38 (H) 04/04/2023   Last lipids Lab Results  Component Value Date   CHOL 196  05/31/2021   HDL 59.90 05/31/2021   LDLCALC 121 (H) 05/31/2021   TRIG 75.0 05/31/2021   CHOLHDL 3 05/31/2021   Last hemoglobin A1c Lab Results  Component Value Date   HGBA1C 5.8 06/12/2016   Last thyroid  functions Lab Results  Component Value Date   TSH 1.77 04/04/2023      Objective:  BP 124/82 (BP Location: Left Arm, Patient Position: Sitting, Cuff Size: Normal)   Pulse 84   Temp 97.9 F (36.6 C) (Temporal)   Ht 5\' 5"  (1.651 m)   Wt 221 lb 1.6 oz (100.3 kg)   LMP 10/26/2018   SpO2 99%   BMI 36.79 kg/m      Physical Exam Vitals and nursing note reviewed.  Cardiovascular:     Rate and Rhythm: Normal rate and regular rhythm.     Pulses: Normal pulses.     Heart sounds: Normal heart sounds.  Pulmonary:     Effort: Pulmonary effort is normal.     Breath sounds: Normal breath sounds.  Neurological:     Mental Status: She is alert and oriented to person, place, and time.     No results found for any visits on 07/10/23.    Assessment & Plan:    Problem  List Items Addressed This Visit     Class 2 severe obesity due to excess calories with serious comorbidity and body mass index (BMI) of 36.0 to 36.9 in adult [facility])   Encouraged to maintain a heart healthy diet and daily exerise She requested referral to weight management clinic Wt Readings from Last 3 Encounters:  07/10/23 221 lb 1.6 oz (100.3 kg)  04/09/23 218 lb 3.2 oz (99 kg)  04/04/23 223 lb 6.4 oz (101.3 kg)         Relevant Orders   Amb Ref to Medical Weight Management   HTN (hypertension), benign - Primary   BP remains at goal without use of amlodipine   BP Readings from Last 3 Encounters:  07/10/23 124/82  04/09/23 122/84  04/04/23 (!) 160/110    Advised to sent home BP readings via mychart in 1-2weeks F/up in 6months      Relevant Orders   Amb Ref to Medical Weight Management   Other Visit Diagnoses       Colon cancer screening       Relevant Orders   Ambulatory referral to  Gastroenterology      Return in about 6 months (around 01/10/2024) for CPE (fasting).     Kathrene Parents, NP

## 2023-07-12 ENCOUNTER — Encounter: Payer: Self-pay | Admitting: Nurse Practitioner

## 2023-07-12 DIAGNOSIS — E66812 Obesity, class 2: Secondary | ICD-10-CM | POA: Insufficient documentation

## 2023-07-12 NOTE — Assessment & Plan Note (Signed)
 Encouraged to maintain a heart healthy diet and daily exerise She requested referral to weight management clinic Wt Readings from Last 3 Encounters:  07/10/23 221 lb 1.6 oz (100.3 kg)  04/09/23 218 lb 3.2 oz (99 kg)  04/04/23 223 lb 6.4 oz (101.3 kg)

## 2023-07-12 NOTE — Assessment & Plan Note (Signed)
 BP remains at goal without use of amlodipine   BP Readings from Last 3 Encounters:  07/10/23 124/82  04/09/23 122/84  04/04/23 (!) 160/110    Advised to sent home BP readings via mychart in 1-2weeks F/up in 6months

## 2023-07-23 ENCOUNTER — Encounter: Payer: Self-pay | Admitting: Pediatrics

## 2023-07-30 ENCOUNTER — Ambulatory Visit: Payer: BC Managed Care – PPO | Admitting: Dermatology

## 2023-07-30 ENCOUNTER — Encounter: Payer: Self-pay | Admitting: Dermatology

## 2023-07-30 VITALS — BP 139/94

## 2023-07-30 DIAGNOSIS — L700 Acne vulgaris: Secondary | ICD-10-CM

## 2023-07-30 DIAGNOSIS — L719 Rosacea, unspecified: Secondary | ICD-10-CM | POA: Diagnosis not present

## 2023-07-30 MED ORDER — AZELAIC ACID 15 % EX GEL: 1.0000 | Freq: Two times a day (BID) | CUTANEOUS | 0 refills | Status: DC

## 2023-07-30 MED ORDER — ADAPALENE 0.3 % EX GEL: 1.0000 | Freq: Every day | CUTANEOUS | 0 refills | Status: DC

## 2023-07-30 NOTE — Progress Notes (Unsigned)
   New Patient Visit   Subjective  Robin Townsend is a 48 y.o. female who presents for the following: Face has been breaking out in fine bumps over the last year. It has improved but not cleared. She uses a gentle cleanser from La Roche Posay sometimes. She was given metronidazole 0.75% cream in February but it didn't help much.    The following portions of the chart were reviewed this encounter and updated as appropriate: medications, allergies, medical history  Review of Systems:  No other skin or systemic complaints except as noted in HPI or Assessment and Plan.  Objective  Well appearing patient in no apparent distress; mood and affect are within normal limits.   A focused examination was performed of the following areas: Face   Relevant exam findings are noted in the Assessment and Plan.    Assessment & Plan   ROSACEA/ACNE Exam Mild erythema of cheeks with few papules and pustules.  Rosacea is a chronic progressive skin condition usually affecting the face of adults, causing redness and/or acne bumps. It is treatable but not curable. It sometimes affects the eyes (ocular rosacea) as well. It may respond to topical and/or systemic medication and can flare with stress, sun exposure, alcohol, exercise, topical steroids (including hydrocortisone/cortisone 10) and some foods.  Daily application of broad spectrum spf 30+ sunscreen to face is recommended to reduce flares.  Patient denies grittiness of the eyes***  Treatment Plan Recommend Cerave BP wash in the morning.  Recommend sunscreen daily.  Start Azelaic acid 15% gel     No follow-ups on file.  I, Eliot Guernsey, CMA, am acting as scribe for [employer], DO .   Documentation: I have reviewed the above documentation for accuracy and completeness, and I agree with the above.  Robin Roup, DO

## 2023-07-30 NOTE — Patient Instructions (Signed)
 Date: Wed Jul 30 2023  Hello [name],  Thank you for visiting today. Here is a summary of the key instructions:  Skin Care Routine: - Morning:   - Wash face with CeraVe benzoyl peroxide cleanser   - Apply 15% azelaic acid gel   - Apply moisturizer with mineral sunscreen (like Neutrogena sheer mineral)  - Night:   - Wash face with La Roche-Posay Hydrating Cleanser   - Apply treatments as follows:     - 2 nights per week: Use a pea-sized amount of adapalene, then moisturizer     - Other nights: Apply azelaic acid gel, then moisturizer  - Medications:   - Use 15% azelaic acid gel twice daily, except on nights when using adapalene   - Use adapalene (prescription retinoid) 2 nights per week  - Sun Protection:   - Always use mineral sunscreen in the morning   - Reapply sunscreen every 4 hours when outside  - Lifestyle Changes:   - Try to identify triggers like red wine, alcohol, spicy foods, or acidic foods  - Follow-up:   - Return for a follow-up appointment in 4 months  Please reach out if you have any questions or concerns.  Warm regards,  Dr. Louana Roup, Dermatology       Important Information  Due to recent changes in healthcare laws, you may see results of your pathology and/or laboratory studies on MyChart before the doctors have had a chance to review them. We understand that in some cases there may be results that are confusing or concerning to you. Please understand that not all results are received at the same time and often the doctors may need to interpret multiple results in order to provide you with the best plan of care or course of treatment. Therefore, we ask that you please give us  2 business days to thoroughly review all your results before contacting the office for clarification. Should we see a critical lab result, you will be contacted sooner.   If You Need Anything After Your Visit  If you have any questions or concerns for your doctor,  please call our main line at [phone] If no one answers, please leave a voicemail as directed and we will return your call as soon as possible. Messages left after 4 pm will be answered the following business day.   You may also send us  a message via MyChart. We typically respond to MyChart messages within 1-2 business days.  For prescription refills, please ask your pharmacy to contact our office. Our fax number is [phone].  If you have an urgent issue when the clinic is closed that cannot wait until the next business day, you can page your doctor at the number below.    Please note that while we do our best to be available for urgent issues outside of office hours, we are not available 24/7.   If you have an urgent issue and are unable to reach us , you may choose to seek medical care at your doctor's office, retail clinic, urgent care center, or emergency room.  If you have a medical emergency, please immediately call 911 or go to the emergency department. In the event of inclement weather, please call our main line at [phone] for an update on the status of any delays or closures.  Dermatology Medication Tips: Please keep the boxes that topical medications come in in order to help keep track of the instructions about where and how to use these. Pharmacies  typically print the medication instructions only on the boxes and not directly on the medication tubes.   If your medication is too expensive, please contact our office at [phone] or send us  a message through MyChart.   We are unable to tell what your co-pay for medications will be in advance as this is different depending on your insurance coverage. However, we may be able to find a substitute medication at lower cost or fill out paperwork to get insurance to cover a needed medication.   If a prior authorization is required to get your medication covered by your insurance company, please allow us  1-2 business days to  complete this process.  Drug prices often vary depending on where the prescription is filled and some pharmacies may offer cheaper prices.  The website www.goodrx.com contains coupons for medications through different pharmacies. The prices here do not account for what the cost may be with help from insurance (it may be cheaper with your insurance), but the website can give you the price if you did not use any insurance.  - You can print the associated coupon and take it with your prescription to the pharmacy.  - You may also stop by our office during regular business hours and pick up a GoodRx coupon card.  - If you need your prescription sent electronically to a different pharmacy, notify our office through [facility] or by phone at [phone]

## 2023-08-14 ENCOUNTER — Ambulatory Visit: Admitting: Nurse Practitioner

## 2023-08-26 ENCOUNTER — Other Ambulatory Visit: Payer: Self-pay | Admitting: Dermatology

## 2023-08-26 DIAGNOSIS — L719 Rosacea, unspecified: Secondary | ICD-10-CM

## 2023-09-02 ENCOUNTER — Other Ambulatory Visit: Payer: Self-pay | Admitting: Dermatology

## 2023-09-04 ENCOUNTER — Encounter: Payer: Self-pay | Admitting: Pediatrics

## 2023-09-04 ENCOUNTER — Ambulatory Visit (AMBULATORY_SURGERY_CENTER): Admitting: *Deleted

## 2023-09-04 VITALS — Ht 65.0 in | Wt 220.0 lb

## 2023-09-04 DIAGNOSIS — Z1211 Encounter for screening for malignant neoplasm of colon: Secondary | ICD-10-CM

## 2023-09-04 MED ORDER — NA SULFATE-K SULFATE-MG SULF 17.5-3.13-1.6 GM/177ML PO SOLN
1.0000 | Freq: Once | ORAL | 0 refills | Status: AC
Start: 2023-09-04 — End: 2023-09-04

## 2023-09-04 NOTE — Progress Notes (Signed)
 Pre visit completed over telephone , Instructions through Mychart    No egg or soy allergy known to patient  No issues known to pt with past sedation with any surgeries or procedures Patient denies ever being told they had issues or difficulty with intubation  No FH of Malignant Hyperthermia Pt is not on diet pills Pt is not on  home 02  Pt is not on blood thinners  Pt denies issues with constipation  No A fib or A flutter Have any cardiac testing pending-- NO Pt instructed to use Singlecare.com or GoodRx for a price reduction on prep

## 2023-09-08 NOTE — Progress Notes (Unsigned)
 [facility] History and Physical   Primary Care Physician:  Nche, Roselie Rockford, NP   Reason for Procedure:  Colorectal cancer screening  Plan:    Screening colonoscopy     HPI: Matisha Hipp is a 48 y.o. female undergoing screening colonoscopy for colorectal cancer screening.  This is the patient's first colonoscopy.  No family history of colorectal cancer or polyps.  Patient denies change in bowel habits or rectal bleeding at the time of this exam.   Past Medical History:  Diagnosis Date   Anemia    Fibroid    Heart murmur    Not sure when was first diagnosis   HTN (hypertension), benign 04/04/2023   Irregular heart beat    dx as youth , told her she had a heart murmur but reports non symptomatic    Menorrhagia     Past Surgical History:  Procedure Laterality Date   ABDOMINAL HYSTERECTOMY  11/2018   LAPAROSCOPIC VAGINAL HYSTERECTOMY WITH SALPINGECTOMY Bilateral 11/17/2018   Procedure: ATTEMPTED LAPAROSCOPIC ASSISTED VAGINAL HYSTERECTOMY CONVERTED TO OPEN ABDOMINAL HYSTERECTOMY WITH SALPINGECTOMY,;  Surgeon: Latisha Medford, MD;  Location: [facility] ;  Service: Gynecology;  Laterality: Bilateral;   TUBAL LIGATION  03/12/2003    Prior to Admission medications   Medication Sig Start Date End Date Taking? Authorizing Provider  Adapalene  0.3 % gel APPLY PEA SIZE AMOUNT TOPICALLY TO FACE AT BEDTIME 2 NIGHTS PER WEEK 09/02/23   Alm Delon SAILOR, DO  Azelaic Acid  15 % gel APPLY 1 APPLICATION TOPICALLY 2 (TWO) TIMES DAILY. AFTER SKIN IS THOROUGHLY WASHED AND PATTED DRY, GENTLY BUT THOROUGHLY MASSAGE A THIN FILM OF AZELAIC ACID  CREAM INTO THE AFFECTED AREA TWICE DAILY, IN THE MORNING AND EVENING. DO NOT USE ON THE NIGHTS YOU APPLY ADAPALENE . 08/26/23   Alm Delon SAILOR, DO  Cholecalciferol (VITAMIN D3) 25 MCG CAPS Take by mouth.    [provider]  Multiple Vitamin (MULTIVITAMIN) tablet Take 1 tablet by mouth daily.    [provider]     Current Outpatient Medications  Medication Sig Dispense Refill   Adapalene  0.3 % gel APPLY PEA SIZE AMOUNT TOPICALLY TO FACE AT BEDTIME 2 NIGHTS PER WEEK 45 g 0   Azelaic Acid  15 % gel APPLY 1 APPLICATION TOPICALLY 2 (TWO) TIMES DAILY. AFTER SKIN IS THOROUGHLY WASHED AND PATTED DRY, GENTLY BUT THOROUGHLY MASSAGE A THIN FILM OF AZELAIC ACID  CREAM INTO THE AFFECTED AREA TWICE DAILY, IN THE MORNING AND EVENING. DO NOT USE ON THE NIGHTS YOU APPLY ADAPALENE . 50 g 11   Cholecalciferol (VITAMIN D3) 25 MCG CAPS Take by mouth.     Multiple Vitamin (MULTIVITAMIN) tablet Take 1 tablet by mouth daily.     No current facility-administered medications for this visit.    Allergies as of 09/09/2023 - Review Complete 07/30/2023  Allergen Reaction Noted   Naproxen sodium Swelling 08/08/2010    Family History  Problem Relation Age of Onset   Hyperlipidemia Mother    Hypertension Mother    Cancer Father        lung cancer   Anemia Sister    Hyperlipidemia Sister    Hypertension Sister    Colon cancer Neg Hx     Social History   Socioeconomic History   Marital status: Married    Spouse name: Not on file   Number of children: 2   Years of education: Not on file   Highest education level: Associate degree: academic program  Occupational History   Occupation: mortgage  specialist  Tobacco Use   Smoking status: Never   Smokeless tobacco: Never  Substance and Sexual Activity   Alcohol use: Yes    Alcohol/week: 2.0 standard drinks of alcohol    Types: 2 Glasses of wine per week    Comment: 2 times a week    Drug use: No   Sexual activity: Yes    Partners: Male    Birth control/protection: None  Other Topics Concern   Not on file  Social History Narrative   Not on file   Social Drivers of Health   Financial Resource Strain: Low Risk  (04/03/2023)   Overall Financial Resource Strain (CARDIA)    Difficulty of Paying Living Expenses: Not hard at all  Food Insecurity: No Food  Insecurity (04/03/2023)   Hunger Vital Sign    Worried About Running Out of Food in the Last Year: Never true    Ran Out of Food in the Last Year: Never true  Transportation Needs: No Transportation Needs (04/03/2023)   PRAPARE - Administrator, Civil Service (Medical): No    Lack of Transportation (Non-Medical): No  Physical Activity: Insufficiently Active (04/03/2023)   Exercise Vital Sign    Days of Exercise per Week: 3 days    Minutes of Exercise per Session: 30 min  Stress: No Stress Concern Present (04/03/2023)   [employer] of Occupational Health - Occupational Stress Questionnaire    Feeling of Stress : Only a little  Social Connections: Unknown (04/03/2023)   Social Connection and Isolation Panel    Frequency of Communication with Friends and Family: Twice a week    Frequency of Social Gatherings with Friends and Family: Not on file    Attends Religious Services: Never    Database administrator or Organizations: No    Attends Engineer, structural: Not on file    Marital Status: Married  Catering manager Violence: Not on file    Review of Systems:  All other review of systems negative except as mentioned in the HPI.  Physical Exam: Vital signs LMP 10/26/2018   General:   Alert,  Well-developed, well-nourished, pleasant and cooperative in NAD Airway:  Mallampati  Lungs:  Clear throughout to auscultation.   Heart:  Regular rate and rhythm; no murmurs, clicks, rubs,  or gallops. Abdomen:  Soft, nontender and nondistended. Normal bowel sounds.   Neuro/Psych:  Normal mood and affect. A and O x 3  Inocente Hausen, MD [facility]

## 2023-09-09 ENCOUNTER — Encounter: Payer: Self-pay | Admitting: Pediatrics

## 2023-09-09 ENCOUNTER — Ambulatory Visit (AMBULATORY_SURGERY_CENTER): Admitting: Pediatrics

## 2023-09-09 VITALS — BP 123/83 | HR 74 | Temp 97.4°F | Resp 14 | Ht 65.0 in | Wt 220.0 lb

## 2023-09-09 DIAGNOSIS — K648 Other hemorrhoids: Secondary | ICD-10-CM

## 2023-09-09 DIAGNOSIS — Z1211 Encounter for screening for malignant neoplasm of colon: Secondary | ICD-10-CM | POA: Diagnosis not present

## 2023-09-09 MED ORDER — SODIUM CHLORIDE 0.9 % IV SOLN: 500.0000 mL | INTRAVENOUS | Status: DC

## 2023-09-09 NOTE — Patient Instructions (Signed)
 Please read handouts provided. Return to referring physician. Repeat colonoscopy in 10 years for screening.   YOU HAD AN ENDOSCOPIC PROCEDURE TODAY AT THE [facility]:   Refer to the procedure report that was given to you for any specific questions about what was found during the examination.  If the procedure report does not answer your questions, please call your gastroenterologist to clarify.  If you requested that your care partner not be given the details of your procedure findings, then the procedure report has been included in a sealed envelope for you to review at your convenience later.  YOU SHOULD EXPECT: Some feelings of bloating in the abdomen. Passage of more gas than usual.  Walking can help get rid of the air that was put into your GI tract during the procedure and reduce the bloating. If you had a lower endoscopy (such as a colonoscopy or flexible sigmoidoscopy) you may notice spotting of blood in your stool or on the toilet paper. If you underwent a bowel prep for your procedure, you may not have a normal bowel movement for a few days.  Please Note:  You might notice some irritation and congestion in your nose or some drainage.  This is from the oxygen used during your procedure.  There is no need for concern and it should clear up in a day or so.  SYMPTOMS TO REPORT IMMEDIATELY:  Following lower endoscopy (colonoscopy or flexible sigmoidoscopy):  Excessive amounts of blood in the stool  Significant tenderness or worsening of abdominal pains  Swelling of the abdomen that is new, acute  Fever of 100F or higher.  For urgent or emergent issues, a gastroenterologist can be reached at any hour by calling [phone]. Do not use MyChart messaging for urgent concerns.    DIET:  We do recommend a small meal at first, but then you may proceed to your regular diet.  Drink plenty of fluids but you should avoid alcoholic beverages for 24 hours.  ACTIVITY:  You should  plan to take it easy for the rest of today and you should NOT DRIVE or use heavy machinery until tomorrow (because of the sedation medicines used during the test).    FOLLOW UP: Our staff will call the number listed on your records the next business day following your procedure.  We will call around 7:15- 8:00 am to check on you and address any questions or concerns that you may have regarding the information given to you following your procedure. If we do not reach you, we will leave a message.     If any biopsies were taken you will be contacted by phone or by letter within the next 1-3 weeks.  Please call us  at [phone] if you have not heard about the biopsies in 3 weeks.    SIGNATURES/CONFIDENTIALITY: You and/or your care partner have signed paperwork which will be entered into your electronic medical record.  These signatures attest to the fact that that the information above on your After Visit Summary has been reviewed and is understood.  Full responsibility of the confidentiality of this discharge information lies with you and/or your care-partner.

## 2023-09-09 NOTE — Op Note (Signed)
 [facility] Patient Name: Robin Townsend Procedure Date: 09/09/2023 6:59 AM MRN: [id number] Endoscopist: Inocente Hausen , MD, [id number] Age: 48 Referring MD:  Date of Birth: [birth year] Gender: Female Account #: [id number] Procedure:                Colonoscopy Indications:              Screening for colorectal malignant neoplasm, This                            is the patient's first colonoscopy Medicines:                Monitored Anesthesia Care Procedure:                Pre-Anesthesia Assessment:                           - Prior to the procedure, a History and Physical                            was performed, and patient medications and                            allergies were reviewed. The patient's tolerance of                            previous anesthesia was also reviewed. The risks                            and benefits of the procedure and the sedation                            options and risks were discussed with the patient.                            All questions were answered, and informed consent                            was obtained. Prior Anticoagulants: The patient has                            taken no anticoagulant or antiplatelet agents. ASA                            Grade Assessment: II - A patient with mild systemic                            disease. After reviewing the risks and benefits,                            the patient was deemed in satisfactory condition to                            undergo the procedure.  After obtaining informed consent, the colonoscope                            was passed under direct vision. Throughout the                            procedure, the patient's blood pressure, pulse, and                            oxygen saturations were monitored continuously. The                            CF [id number] #[id number] was introduced through the anus                            and advanced to the  cecum, identified by                            appendiceal orifice and ileocecal valve. The                            colonoscopy was performed without difficulty. The                            patient tolerated the procedure well. The quality                            of the bowel preparation was good. The ileocecal                            valve, appendiceal orifice, and rectum were                            photographed. Scope In: 8:00:23 AM Scope Out: 8:12:43 AM Scope Withdrawal Time: 0 hours 9 minutes 27 seconds  Total Procedure Duration: 0 hours 12 minutes 20 seconds  Findings:                 The perianal and digital rectal examinations were                            normal. Pertinent negatives include normal                            sphincter tone and no palpable rectal lesions.                           The colon (entire examined portion) appeared normal.                           Internal hemorrhoids were found during retroflexion. Complications:            No immediate complications. Estimated blood loss:  None. Estimated Blood Loss:     Estimated blood loss: none. Impression:               - The entire examined colon is normal.                           - Internal hemorrhoids.                           - No specimens collected. Recommendation:           - Discharge patient to home (ambulatory).                           - Repeat colonoscopy in 10 years for screening                            purposes.                           - The findings and recommendations were discussed                            with the patient's family.                           - Return to referring physician.                           - Patient has a contact number available for                            emergencies. The signs and symptoms of potential                            delayed complications were discussed with the                            patient.  Return to normal activities tomorrow.                            Written discharge instructions were provided to the                            patient. Inocente Hausen, MD 09/09/2023 8:16:55 AM This report has been signed electronically.

## 2023-09-09 NOTE — Progress Notes (Signed)
 Vss nad trans to pacu

## 2023-09-09 NOTE — Progress Notes (Signed)
 Pt's states no medical or surgical changes since previsit or office visit.

## 2023-09-10 ENCOUNTER — Telehealth: Payer: Self-pay

## 2023-09-10 NOTE — Telephone Encounter (Signed)
  Follow up Call-     09/09/2023    7:19 AM  Call back number  Post procedure Call Back phone  # [phone]  Permission to leave phone message Yes     Patient questions:  Do you have a fever, pain , or abdominal swelling? No. Pain Score  0 *  Have you tolerated food without any problems? Yes.    Have you been able to return to your normal activities? Yes.    Do you have any questions about your discharge instructions: Diet   No. Medications  No. Follow up visit  No.  Do you have questions or concerns about your Care? No.  Actions: * If pain score is 4 or above: No action needed, pain <4.

## 2023-09-15 ENCOUNTER — Telehealth (INDEPENDENT_AMBULATORY_CARE_PROVIDER_SITE_OTHER): Payer: Self-pay | Admitting: Adult Health

## 2023-09-15 ENCOUNTER — Ambulatory Visit (INDEPENDENT_AMBULATORY_CARE_PROVIDER_SITE_OTHER): Admitting: Adult Health

## 2023-09-15 ENCOUNTER — Encounter (INDEPENDENT_AMBULATORY_CARE_PROVIDER_SITE_OTHER): Payer: Self-pay | Admitting: Adult Health

## 2023-09-15 VITALS — BP 133/90 | HR 90 | Temp 98.0°F | Ht 64.5 in | Wt 220.0 lb

## 2023-09-15 DIAGNOSIS — E669 Obesity, unspecified: Secondary | ICD-10-CM | POA: Diagnosis not present

## 2023-09-15 DIAGNOSIS — I10 Essential (primary) hypertension: Secondary | ICD-10-CM

## 2023-09-15 DIAGNOSIS — R739 Hyperglycemia, unspecified: Secondary | ICD-10-CM | POA: Diagnosis not present

## 2023-09-15 DIAGNOSIS — E559 Vitamin D deficiency, unspecified: Secondary | ICD-10-CM | POA: Diagnosis not present

## 2023-09-15 DIAGNOSIS — Z0289 Encounter for other administrative examinations: Secondary | ICD-10-CM

## 2023-09-15 DIAGNOSIS — Z6837 Body mass index (BMI) 37.0-37.9, adult: Secondary | ICD-10-CM

## 2023-09-15 NOTE — Progress Notes (Signed)
 Office: [phone]  /  Fax: [phone]   Initial Visit    Robin Townsend was seen in clinic today to evaluate for obesity. She is interested in losing weight to improve overall health and reduce the risk of weight related complications. She presents today to review program treatment options, initial physical assessment, and evaluation.     She was referred by: PCP  When asked what else they would like to accomplish? She states: Adopt a healthier eating pattern and lifestyle, Improve energy levels and physical activity, Improve existing medical conditions, Improve quality of life, and Current Weight 220 lbs, Goal Weight 175 lbs  When asked how has your weight affected you? She states: Having fatigue, Having poor endurance, and Problems with eating patterns  Weight history: She reports weight gain since her hysterectomy in 2020  Highest weight: 223 lbs  Some associated conditions: Vitamin D Deficiency and Other: hyperglycemia  Contributing factors: moderate to high levels of stress, reduced physical activity, and s/p hysterectomy  Weight promoting medications identified: None  Prior weight loss attempts: None  Current nutrition plan: None  Current level of physical activity: None  Current or previous pharmacotherapy: None  Response to medication: Never tried medications   Past medical history includes:   Past Medical History:  Diagnosis Date   Anemia    Fibroid    Heart murmur    Not sure when was first diagnosis   HTN (hypertension), benign 04/04/2023   Irregular heart beat    dx as youth , told her she had a heart murmur but reports non symptomatic    Menorrhagia      Objective    BP (!) 133/90   Pulse 90   Temp 98 F (36.7 C)   Ht 5' 4.5 (1.638 m)   Wt 220 lb (99.8 kg)   LMP 10/26/2018   SpO2 99%   BMI 37.18 kg/m  She was weighed on the bioimpedance scale: Body mass index is 37.18 kg/m.  Body Fat%:43.2, Visceral Fat Rating:11, Weight trend over the  last 12 months: Unchanged  General:  Alert, oriented and cooperative. Patient is in no acute distress.  Respiratory: Normal respiratory effort, no problems with respiration noted   Gait: able to ambulate independently  Mental Status: Normal mood and affect. Normal behavior. Normal judgment and thought content.   DIAGNOSTIC DATA REVIEWED:  BMET    Component Value Date/Time   NA 139 04/04/2023 1130   K 4.1 04/04/2023 1130   CL 105 04/04/2023 1130   CO2 25 04/04/2023 1130   GLUCOSE 90 04/04/2023 1130   BUN 10 04/04/2023 1130   CREATININE 0.83 04/04/2023 1130   CALCIUM 9.7 04/04/2023 1130   Lab Results  Component Value Date   HGBA1C 5.8 06/12/2016   No results found for: INSULIN CBC    Component Value Date/Time   WBC 5.4 04/04/2023 1130   RBC 5.04 04/04/2023 1130   HGB 13.8 04/04/2023 1130   HCT 42.7 04/04/2023 1130   PLT 265.0 04/04/2023 1130   MCV 84.8 04/04/2023 1130   MCH 22.8 (L) 11/18/2018 0454   MCHC 32.4 04/04/2023 1130   RDW 13.5 04/04/2023 1130   Iron/TIBC/Ferritin/ %Sat    Component Value Date/Time   IRON 65 06/12/2016 0947   TIBC 358 06/12/2016 0947   FERRITIN 7.4 (L) 06/12/2016 0947   IRONPCTSAT 18 06/12/2016 0947   Lipid Panel     Component Value Date/Time   CHOL 196 05/31/2021 0842   TRIG 75.0 05/31/2021 0842  HDL 59.90 05/31/2021 0842   CHOLHDL 3 05/31/2021 0842   VLDL 15.0 05/31/2021 0842   LDLCALC 121 (H) 05/31/2021 0842   Hepatic Function Panel     Component Value Date/Time   PROT 8.1 04/04/2023 1130   ALBUMIN 4.5 04/04/2023 1130   AST 35 04/04/2023 1130   ALT 38 (H) 04/04/2023 1130   ALKPHOS 66 04/04/2023 1130   BILITOT 0.3 04/04/2023 1130      Component Value Date/Time   TSH 1.77 04/04/2023 1130     Assessment and Plan   HTN (hypertension), benign  Vitamin D deficiency  Hyperglycemia  Obesity (BMI 30-39.9), STARTING BMI 37.19   Assessment and Plan          ESTABLISH WITH [facility]   Obesity Treatment / Action  Plan:  Patient will work on garnering support from family and friends to begin weight loss journey. Will work on eliminating or reducing the presence of highly palatable, calorie dense foods in the home. Will complete provided nutritional and psychosocial assessment questionnaire before the next appointment. Will be scheduled for indirect calorimetry to determine resting energy expenditure in a fasting state.  This will allow us  to create a reduced calorie, high-protein meal plan to promote loss of fat mass while preserving muscle mass. Counseled on the health benefits of losing 5%-15% of total body weight. Was counseled on nutritional approaches to weight loss and benefits of reducing processed foods and consuming plant-based foods and high quality protein as part of nutritional weight management. Was counseled on pharmacotherapy and role as an adjunct in weight management.   Obesity Education Performed Today:  She was weighed on the bioimpedance scale and results were discussed and documented in the synopsis.  We discussed obesity as a disease and the importance of a more detailed evaluation of all the factors contributing to the disease.  We discussed the importance of long term lifestyle changes which include nutrition, exercise and behavioral modifications as well as the importance of customizing this to her specific health and social needs.  We discussed the benefits of reaching a healthier weight to alleviate the symptoms of existing conditions and reduce the risks of the biomechanical, metabolic and psychological effects of obesity.  We reviewed the four pillars of obesity medicine and importance of using a multimodal approach.  We reviewed the basic principles in weight management.   Robin Townsend appears to be in the action stage of change and states they are ready to start intensive lifestyle modifications and behavioral modifications.  I have spent 30 minutes in the care of the  patient today including: 5 minutes before the visit reviewing and preparing the chart. 20 minutes face-to-face assessing and reviewing listed medical problems as outlined in obesity care plan, providing nutritional and behavioral counseling on topics outlined in the obesity care plan, counseling regarding anti-obesity medication as outlined in obesity care plan, independently interpreting test results and goals of care, as described in assessment and plan, and reviewing and discussing biometric information and progress 5 minutes after the visit updating chart and documentation of encounter.  Reviewed by clinician on day of visit: allergies, medications, problem list, medical history, surgical history, family history, social history, and previous encounter notes pertinent to obesity diagnosis.  Karsin Pesta d. Birdella Sippel, NP-C

## 2023-09-15 NOTE — Telephone Encounter (Signed)
 Completed.

## 2023-09-30 ENCOUNTER — Other Ambulatory Visit: Payer: Self-pay | Admitting: Dermatology

## 2023-10-27 ENCOUNTER — Encounter: Payer: Self-pay | Admitting: Obstetrics and Gynecology

## 2023-10-28 ENCOUNTER — Other Ambulatory Visit: Payer: Self-pay | Admitting: Obstetrics and Gynecology

## 2023-10-28 DIAGNOSIS — N6489 Other specified disorders of breast: Secondary | ICD-10-CM

## 2023-11-11 ENCOUNTER — Ambulatory Visit
Admission: RE | Admit: 2023-11-11 | Discharge: 2023-11-11 | Disposition: A | Discharge status: Home / Self Care | Source: Ambulatory Visit | Attending: Obstetrics and Gynecology | Admitting: Obstetrics and Gynecology

## 2023-11-11 ENCOUNTER — Encounter

## 2023-11-11 ENCOUNTER — Encounter (INDEPENDENT_AMBULATORY_CARE_PROVIDER_SITE_OTHER): Payer: Self-pay

## 2023-11-11 DIAGNOSIS — N6489 Other specified disorders of breast: Secondary | ICD-10-CM

## 2023-11-11 DIAGNOSIS — R928 Other abnormal and inconclusive findings on diagnostic imaging of breast: Secondary | ICD-10-CM | POA: Diagnosis not present

## 2023-11-26 ENCOUNTER — Ambulatory Visit (INDEPENDENT_AMBULATORY_CARE_PROVIDER_SITE_OTHER): Admitting: Family Medicine

## 2023-11-26 ENCOUNTER — Encounter (INDEPENDENT_AMBULATORY_CARE_PROVIDER_SITE_OTHER): Payer: Self-pay | Admitting: Family Medicine

## 2023-11-26 VITALS — BP 129/86 | HR 78 | Temp 97.8°F | Ht 64.0 in | Wt 224.0 lb

## 2023-11-26 DIAGNOSIS — E559 Vitamin D deficiency, unspecified: Secondary | ICD-10-CM | POA: Diagnosis not present

## 2023-11-26 DIAGNOSIS — D649 Anemia, unspecified: Secondary | ICD-10-CM | POA: Diagnosis not present

## 2023-11-26 DIAGNOSIS — I10 Essential (primary) hypertension: Secondary | ICD-10-CM | POA: Diagnosis not present

## 2023-11-26 DIAGNOSIS — R0602 Shortness of breath: Secondary | ICD-10-CM | POA: Diagnosis not present

## 2023-11-26 DIAGNOSIS — Z1331 Encounter for screening for depression: Secondary | ICD-10-CM | POA: Diagnosis not present

## 2023-11-26 DIAGNOSIS — R739 Hyperglycemia, unspecified: Secondary | ICD-10-CM

## 2023-11-26 DIAGNOSIS — Z6838 Body mass index (BMI) 38.0-38.9, adult: Secondary | ICD-10-CM

## 2023-11-26 DIAGNOSIS — E7800 Pure hypercholesterolemia, unspecified: Secondary | ICD-10-CM | POA: Diagnosis not present

## 2023-11-26 DIAGNOSIS — R5383 Other fatigue: Secondary | ICD-10-CM | POA: Diagnosis not present

## 2023-11-26 DIAGNOSIS — E669 Obesity, unspecified: Secondary | ICD-10-CM

## 2023-11-26 NOTE — Progress Notes (Addendum)
 Office: [phone]  /  Fax: [phone]  WEIGHT SUMMARY AND BIOMETRICS  Anthropometric Measurements Height: 5' 4 (1.626 m) Weight: 224 lb (101.6 kg) BMI (Calculated): 38.43 Starting Weight: 224 lb Peak Weight: 224 lb Waist Measurement : 44 inches   Body Composition  Body Fat %: 44.5 % Fat Mass (lbs): 100 lbs Muscle Mass (lbs): 118.4 lbs Total Body Water (lbs): 81.2 lbs Visceral Fat Rating : 12   Other Clinical Data Fasting: yes Labs: yes Today's Visit #: 1 Starting Date: 11/26/23    Chief Complaint: OBESITY   History of Present Illness Robin Townsend is a 48 year old female who presents to start the workup for her obesity diagnosis to determine her most ideal treatment options.  She has experienced weight gain following a hysterectomy, which was exacerbated by working from home and increased sedentary behavior. Her eating habits changed post-surgery, leading to increased food intake while being less active. She identifies poor food choices and overeating as issues, noting that her body does not give her appropriate hunger signals, leading to eating until she feels uncomfortable. She acknowledges some stress and reward eating but does not consider it a major issue.  She has a history of hypertension, hyperlipidemia, hyperglycemia, and vitamin D  deficiency, all of which may be impacted by her weight. She is currently on very little medication, taking a one-a-day multivitamin in gummy form.  She experiences fatigue and shortness of breath with exercise.  Her social history includes living with her husband, who is supportive of her dietary changes, and working 40 hours a week, primarily Monday through Friday. She shops together weekly and has a routine for cooking meals, often preparing food for two days at a time. She describes herself as a picky eater, particularly avoiding seafood and pork, but is open to other foods. She reports not being a nighttime snacker but  occasionally indulges in chips or ice cream.  She sleeps about six hours a night and tries to catch up on sleep during weekends. She feels tired despite this amount of sleep and recognizes the importance of improving her sleep for weight loss.  Testing today includes:  Basal Metabolic Rate calculated via Bioimpedance scale 1746 Resting Energy Expenditure measured via Indirect Calorimeter 1686 and is lower  than expected. PHQ-9 score was WNL at 0 Epworth Sleepiness Score was WNL at 8 ECG: NSR @ 71 BPM no QT prolongation       PHYSICAL EXAM:  Blood pressure 129/86, pulse 78, temperature 97.8 F (36.6 C), height 5' 4 (1.626 m), weight 224 lb (101.6 kg), last menstrual period 10/26/2018, SpO2 100%. Body mass index is 38.45 kg/m.  DIAGNOSTIC DATA REVIEWED:  BMET    Component Value Date/Time   NA 139 04/04/2023 1130   K 4.1 04/04/2023 1130   CL 105 04/04/2023 1130   CO2 25 04/04/2023 1130   GLUCOSE 90 04/04/2023 1130   BUN 10 04/04/2023 1130   CREATININE 0.83 04/04/2023 1130   CALCIUM 9.7 04/04/2023 1130   Lab Results  Component Value Date   HGBA1C 5.8 06/12/2016   No results found for: INSULIN  Lab Results  Component Value Date   TSH 1.77 04/04/2023   CBC    Component Value Date/Time   WBC 5.4 04/04/2023 1130   RBC 5.04 04/04/2023 1130   HGB 13.8 04/04/2023 1130   HCT 42.7 04/04/2023 1130   PLT 265.0 04/04/2023 1130   MCV 84.8 04/04/2023 1130   MCH 22.8 (L) 11/18/2018 0454   MCHC 32.4  04/04/2023 1130   RDW 13.5 04/04/2023 1130   Iron Studies    Component Value Date/Time   IRON 65 06/12/2016 0947   TIBC 358 06/12/2016 0947   FERRITIN 7.4 (L) 06/12/2016 0947   IRONPCTSAT 18 06/12/2016 0947   Lipid Panel     Component Value Date/Time   CHOL 196 05/31/2021 0842   TRIG 75.0 05/31/2021 0842   HDL 59.90 05/31/2021 0842   CHOLHDL 3 05/31/2021 0842   VLDL 15.0 05/31/2021 0842   LDLCALC 121 (H) 05/31/2021 0842   Hepatic Function Panel     Component  Value Date/Time   PROT 8.1 04/04/2023 1130   ALBUMIN 4.5 04/04/2023 1130   AST 35 04/04/2023 1130   ALT 38 (H) 04/04/2023 1130   ALKPHOS 66 04/04/2023 1130   BILITOT 0.3 04/04/2023 1130      Component Value Date/Time   TSH 1.77 04/04/2023 1130   Nutritional No results found for: VD25OH   Assessment and Plan Assessment & Plan Obesity Obesity management is the primary focus, with an emphasis on understanding genetic mechanisms that may resist weight loss. The goal is to identify active mechanisms and tailor a plan that avoids triggering the body's defense against weight loss. She is not considering weight loss surgery and is slightly below the weight threshold for it. The goal weight is 175 pounds, which is achievable but aggressive. Rapid weight loss can trigger the body's defense mechanisms, so a balanced approach is necessary. - Initiate Category 2 eating plan with a provided grocery list. - Educate on using a food scale for portion control, especially for protein. - Advise against weighing herself between visits. - Schedule follow-up in two weeks to discuss test results and eating plan progress. - Instruct not to start exercise regimen until further notice.  Essential hypertension Hypertension management will be integrated into the overall weight management plan, as weight loss can positively impact blood pressure levels. - Check labs Hyperlipidemia Hyperlipidemia can be influenced by weight. The dietary plan will aim to address this by promoting healthier eating habits. - Check labs  Hyperglycemia Hyperglycemia can be impacted by weight. The dietary plan will aim to improve blood sugar levels through healthier eating habits. - Check labs  Vitamin D  deficiency Vitamin D  deficiency is noted. Management will include monitoring vitamin levels, with supplementation discussed after reviewing lab results.  Fatigue Fatigue may be related to weight, but other causes will be  investigated. She reports feeling tired despite six hours of sleep and attempts to catch up on weekends. Improving sleep duration to seven hours is recommended to aid weight loss. - Order labs to investigate other causes of fatigue. - Advise on increasing sleep duration to seven hours.  Anemia Anemia is noted. Further investigation will determine if anemia contributes to fatigue. - Order labs to assess anemia status.  Dyspnea on exertion Dyspnea on exertion is noted, potentially related to exercise intolerance or other factors. Further evaluation will occur as exercise is introduced into the plan. - Evaluate dyspnea as exercise is introduced.     I personally spent a total of 45 minutes in the care of the patient today including preparing to see the patient, reviewing separately obtained history, performing a medically appropriate evaluation of current problems, placing orders in the EMR, documenting clinical information in the EMR, customized nutritional counseling for their specific health and social needs, and explaining the pathophysiology of obesity and how it is significantly more complex than eat less and exercise more.  Adalei was informed of the importance of frequent follow up visits to maximize her success with intensive lifestyle modifications for her obesity and obesity related health conditions as recommended by USPSTF and CMS guidelines   Louann Penton, MD

## 2023-11-27 LAB — CBC WITH DIFFERENTIAL/PLATELET
Basophils Absolute: 0 x10E3/uL (ref 0.0–0.2)
Basos: 1 %
EOS (ABSOLUTE): 0.1 x10E3/uL (ref 0.0–0.4)
Eos: 2 %
Hematocrit: 41.9 % (ref 34.0–46.6)
Hemoglobin: 13.5 g/dL (ref 11.1–15.9)
Immature Grans (Abs): 0 x10E3/uL (ref 0.0–0.1)
Immature Granulocytes: 0 %
Lymphocytes Absolute: 2.1 x10E3/uL (ref 0.7–3.1)
Lymphs: 42 %
MCH: 27.3 pg (ref 26.6–33.0)
MCHC: 32.2 g/dL (ref 31.5–35.7)
MCV: 85 fL (ref 79–97)
Monocytes Absolute: 0.3 x10E3/uL (ref 0.1–0.9)
Monocytes: 6 %
Neutrophils Absolute: 2.4 x10E3/uL (ref 1.4–7.0)
Neutrophils: 49 %
Platelets: 256 x10E3/uL (ref 150–450)
RBC: 4.95 x10E6/uL (ref 3.77–5.28)
RDW: 13.8 % (ref 11.7–15.4)
WBC: 5 x10E3/uL (ref 3.4–10.8)

## 2023-11-27 LAB — LIPID PANEL WITH LDL/HDL RATIO
Cholesterol, Total: 206 mg/dL — ABNORMAL HIGH (ref 100–199)
HDL: 63 mg/dL (ref >39)
LDL Chol Calc (NIH): 132 mg/dL — ABNORMAL HIGH (ref 0–99)
LDL/HDL Ratio: 2.1 ratio (ref 0.0–3.2)
Triglycerides: 61 mg/dL (ref 0–149)
VLDL Cholesterol Cal: 11 mg/dL (ref 5–40)

## 2023-11-27 LAB — CMP14+EGFR
ALT: 56 IU/L — ABNORMAL HIGH (ref 0–32)
AST: 49 IU/L — ABNORMAL HIGH (ref 0–40)
Albumin: 4.4 g/dL (ref 3.9–4.9)
Alkaline Phosphatase: 95 IU/L (ref 41–116)
BUN/Creatinine Ratio: 14 (ref 9–23)
BUN: 11 mg/dL (ref 6–24)
Bilirubin Total: 0.3 mg/dL (ref 0.0–1.2)
CO2: 19 mmol/L — ABNORMAL LOW (ref 20–29)
Calcium: 9.6 mg/dL (ref 8.7–10.2)
Chloride: 104 mmol/L (ref 96–106)
Creatinine, Ser: 0.8 mg/dL (ref 0.57–1.00)
Globulin, Total: 2.9 g/dL (ref 1.5–4.5)
Glucose: 86 mg/dL (ref 70–99)
Potassium: 4.5 mmol/L (ref 3.5–5.2)
Sodium: 139 mmol/L (ref 134–144)
Total Protein: 7.3 g/dL (ref 6.0–8.5)
eGFR: 91 mL/min/1.73 (ref >59)

## 2023-11-27 LAB — HEMOGLOBIN A1C
Est. average glucose Bld gHb Est-mCnc: 120 mg/dL
Hgb A1c MFr Bld: 5.8 % — ABNORMAL HIGH (ref 4.8–5.6)

## 2023-11-27 LAB — INSULIN, RANDOM: INSULIN: 24.9 u[IU]/mL (ref 2.6–24.9)

## 2023-11-27 LAB — VITAMIN B12: Vitamin B-12: 983 pg/mL (ref 232–1245)

## 2023-11-27 LAB — T3: T3, Total: 123 ng/dL (ref 71–180)

## 2023-11-27 LAB — FOLATE: Folate: >20 ng/mL (ref >3.0)

## 2023-11-27 LAB — TSH: TSH: 1.14 u[IU]/mL (ref 0.450–4.500)

## 2023-11-27 LAB — T4, FREE: Free T4: 0.99 ng/dL (ref 0.82–1.77)

## 2023-11-27 LAB — VITAMIN D 25 HYDROXY (VIT D DEFICIENCY, FRACTURES): Vit D, 25-Hydroxy: 28.1 ng/mL — ABNORMAL LOW (ref 30.0–100.0)

## 2023-12-10 ENCOUNTER — Ambulatory Visit (INDEPENDENT_AMBULATORY_CARE_PROVIDER_SITE_OTHER): Admitting: Family Medicine

## 2023-12-10 ENCOUNTER — Encounter (INDEPENDENT_AMBULATORY_CARE_PROVIDER_SITE_OTHER): Payer: Self-pay | Admitting: Family Medicine

## 2023-12-10 VITALS — BP 129/89 | HR 88 | Temp 98.2°F | Ht 64.0 in | Wt 219.0 lb

## 2023-12-10 DIAGNOSIS — R7303 Prediabetes: Secondary | ICD-10-CM

## 2023-12-10 DIAGNOSIS — E559 Vitamin D deficiency, unspecified: Secondary | ICD-10-CM | POA: Diagnosis not present

## 2023-12-10 DIAGNOSIS — E669 Obesity, unspecified: Secondary | ICD-10-CM

## 2023-12-10 DIAGNOSIS — E7800 Pure hypercholesterolemia, unspecified: Secondary | ICD-10-CM | POA: Diagnosis not present

## 2023-12-10 DIAGNOSIS — Z6837 Body mass index (BMI) 37.0-37.9, adult: Secondary | ICD-10-CM

## 2023-12-10 DIAGNOSIS — K760 Fatty (change of) liver, not elsewhere classified: Secondary | ICD-10-CM | POA: Diagnosis not present

## 2023-12-10 DIAGNOSIS — Z6838 Body mass index (BMI) 38.0-38.9, adult: Secondary | ICD-10-CM

## 2023-12-10 MED ORDER — VITAMIN D (ERGOCALCIFEROL) 1.25 MG (50000 UNIT) PO CAPS: 50000.0000 [IU] | ORAL_CAPSULE | ORAL | 0 refills | Status: DC

## 2023-12-10 MED ORDER — METFORMIN HCL 500 MG PO TABS: 500.0000 mg | ORAL_TABLET | Freq: Every day | ORAL | 0 refills | Status: DC

## 2023-12-10 NOTE — Progress Notes (Signed)
 Office: [phone]  /  Fax: [phone]  WEIGHT SUMMARY AND BIOMETRICS  Anthropometric Measurements Height: 5' 4 (1.626 m) Weight: 219 lb (99.3 kg) BMI (Calculated): 37.57 Weight at Last Visit: 224 lb Weight Lost Since Last Visit: 5 lb Weight Gained Since Last Visit: 0 Starting Weight: 224 lb Total Weight Loss (lbs): 5 lb (2.268 kg) Peak Weight: 224 lb Waist Measurement : 44 inches   Body Composition  Body Fat %: 43.2 % Fat Mass (lbs): 94.6 lbs Muscle Mass (lbs): 118.4 lbs Total Body Water (lbs): 80 lbs Visceral Fat Rating : 12   Other Clinical Data RMR: [id number] Fasting: no Labs: no Today's Visit #: 2 Starting Date: 11/26/23    Chief Complaint: OBESITY   History of Present Illness Robin Townsend is a 48 year old female who presents for a follow-up to discuss lab results from an obesity workup.  She has been adhering to a category two eating plan with approximately 90% compliance, resulting in a five-pound weight loss over the past two weeks. She has not yet started an exercise regimen. Breakfast and lunch are challenging, particularly due to her dislike of yogurt compared to her usual choices. She has been creative with snacks, incorporating pretzels and Laughing Cow cheese, and is mindful of her calorie intake without formally tracking them. She drinks water in the afternoon and has no issues with dinner portions.  Her recent lab results were discussed with her, including findings of prediabetes, vitamin D  deficiency, pure hyperlipidemia, and metabolic associated steatohepatitis (MASLD) as reported by her provider. No abdominal pain or yellowing of the eyes. She has a history of elevated liver enzymes in the past. She has had ultrasounds before.  Cholesterol levels show a good HDL at 63, triglycerides at 61, but an elevated LDL at 132, which has increased from 121 a few years ago. Vitamin D  level is low at 28, and she reports knee pain, which she attributes to the  deficiency. Fasting glucose is 86, but hemoglobin A1c is 5.8, placing her in the prediabetes range. Insulin  level is elevated at 24.9, indicating her pancreas is working harder than normal to maintain blood sugar levels.  She has a history of anemia but is not currently anemic. Thyroid  function tests, including TSH, T3, and free T4, are normal. No known family history of prediabetes or diabetes, although she acknowledges that family members may not have been tested. She works from home on Thursdays and Fridays.      PHYSICAL EXAM:  Blood pressure 129/89, pulse 88, temperature 98.2 F (36.8 C), height 5' 4 (1.626 m), weight 219 lb (99.3 kg), last menstrual period 10/26/2018, SpO2 99%. Body mass index is 37.59 kg/m.  DIAGNOSTIC DATA REVIEWED:  BMET    Component Value Date/Time   NA 139 11/26/2023 0833   K 4.5 11/26/2023 0833   CL 104 11/26/2023 0833   CO2 19 (L) 11/26/2023 0833   GLUCOSE 86 11/26/2023 0833   GLUCOSE 90 04/04/2023 1130   BUN 11 11/26/2023 0833   CREATININE 0.80 11/26/2023 0833   CALCIUM 9.6 11/26/2023 0833   Lab Results  Component Value Date   HGBA1C 5.8 (H) 11/26/2023   HGBA1C 5.8 06/12/2016   Lab Results  Component Value Date   INSULIN  24.9 11/26/2023   Lab Results  Component Value Date   TSH 1.140 11/26/2023   CBC    Component Value Date/Time   WBC 5.0 11/26/2023 0833   WBC 5.4 04/04/2023 1130   RBC 4.95 11/26/2023 0833  RBC 5.04 04/04/2023 1130   HGB 13.5 11/26/2023 0833   HCT 41.9 11/26/2023 0833   PLT 256 11/26/2023 0833   MCV 85 11/26/2023 0833   MCH 27.3 11/26/2023 0833   MCH 22.8 (L) 11/18/2018 0454   MCHC 32.2 11/26/2023 0833   MCHC 32.4 04/04/2023 1130   RDW 13.8 11/26/2023 0833   Iron Studies    Component Value Date/Time   IRON 65 06/12/2016 0947   TIBC 358 06/12/2016 0947   FERRITIN 7.4 (L) 06/12/2016 0947   IRONPCTSAT 18 06/12/2016 0947   Lipid Panel     Component Value Date/Time   CHOL 206 (H) 11/26/2023 0833   TRIG  61 11/26/2023 0833   HDL 63 11/26/2023 0833   CHOLHDL 3 05/31/2021 0842   VLDL 15.0 05/31/2021 0842   LDLCALC 132 (H) 11/26/2023 0833   Hepatic Function Panel     Component Value Date/Time   PROT 7.3 11/26/2023 0833   ALBUMIN 4.4 11/26/2023 0833   AST 49 (H) 11/26/2023 0833   ALT 56 (H) 11/26/2023 0833   ALKPHOS 95 11/26/2023 0833   BILITOT 0.3 11/26/2023 0833      Component Value Date/Time   TSH 1.140 11/26/2023 0833   Nutritional Lab Results  Component Value Date   VD25OH 28.1 (L) 11/26/2023     Assessment and Plan Assessment & Plan Obesity with elevated BMI She has lost 5 pounds in the last two weeks following a category two eating plan. The focus is on dietary modifications to aid weight loss and improve metabolic health. - Continue category two eating plan - Provide additional breakfast options and lean meat substitutions - Discussed importance of protein intake and portion control - Encourage adherence to dietary plan to aid weight loss  Prediabetes with insulin  resistance Hemoglobin A1c is 5.8, indicating prediabetes. Fasting glucose is 86, but insulin  level is 24.9, indicating significant insulin  resistance. Discussed the importance of dietary changes to reduce insulin  levels and prevent progression to diabetes. Metformin was discussed as an adjunct to dietary changes to improve insulin  sensitivity and reduce the risk of developing diabetes. Informed her that 10% of people with fatty liver progress to scarring, and 10% of those with scarring progress to liver failure, emphasizing the importance of weight loss and dietary management. - Prescribe Metformin, take after breakfast to minimize GI upset - Educate on dietary changes to reduce insulin  resistance - Discussed potential side effects of Metformin, including queasiness and diarrhea if taken on an empty stomach or with excessive carbohydrates - Provide educational materials on insulin  resistance and  prediabetes  Metabolic dysfunction-associated steatotic liver disease (MASLD) MASLD suspected due to elevated liver enzymes. Weight loss is the primary treatment for MASLD. An ultrasound is recommended to assess the liver and rule out other conditions. Discussed the potential progression of fatty liver to scarring and liver failure if not managed. Emphasized that weight loss can prevent progression and improve liver health. - Order liver ultrasound - Encourage weight loss as primary treatment for MASLD  Pure hypercholesterolemia LDL at 132. HDL is 63, which is good, and triglycerides are 61. The focus is on dietary changes and weight loss to improve lipid profile. - Recheck lipid levels in three months - Continue dietary modifications to improve lipid profile  Vitamin D  deficiency Vitamin D  level of 28, which is below the desired level of 30. This deficiency increases the risk of osteoporosis. - Prescribe vitamin D  supplementation once a week for three months - Reassess vitamin D  levels after  supplementation     I personally spent a total of 56 minutes in the care of the patient today including preparing to see the patient, reviewing separately obtained history, performing a medically appropriate evaluation of current problems, placing orders in the EMR, documenting clinical information in the EMR, customized nutritional counseling for their specific health and social needs, independently interpreting results, discussing results with the patient and educating them on how these results can affect their health and weight, and explaining the pathophysiology of obesity and how it is significantly more complex than eat less and exercise more.    Robin Townsend was informed of the importance of frequent follow up visits to maximize her success with intensive lifestyle modifications for her obesity and obesity related health conditions as recommended by USPSTF and CMS guidelines   Louann Penton,  MD

## 2023-12-12 ENCOUNTER — Ambulatory Visit: Admitting: Nurse Practitioner

## 2023-12-12 ENCOUNTER — Encounter: Payer: Self-pay | Admitting: Nurse Practitioner

## 2023-12-12 VITALS — BP 132/76 | HR 84 | Ht 64.0 in | Wt 224.2 lb

## 2023-12-12 DIAGNOSIS — M25562 Pain in left knee: Secondary | ICD-10-CM

## 2023-12-12 MED ORDER — DICLOFENAC SODIUM 1 % EX GEL: 2.0000 g | Freq: Three times a day (TID) | CUTANEOUS | Status: AC

## 2023-12-12 NOTE — Patient Instructions (Addendum)
 Use voltaren  gel 2-3x/day Ok to alternate between warm and cold compress as needed Start knee exercises daily (2sets of 10repititions for each exercise) Call office if no improvement in 6weeks  Knee Exercises Ask your health care provider which exercises are safe for you. Do exercises exactly as told by your health care provider and adjust them as directed. It is normal to feel mild stretching, pulling, tightness, or discomfort as you do these exercises. Stop right away if you feel sudden pain or your pain gets worse. Do not begin these exercises until told by your health care provider. Stretching and range-of-motion exercises These exercises warm up your muscles and joints and improve the movement and flexibility of your knee. These exercises also help to relieve pain and swelling. Knee extension, prone  Lie on your abdomen (prone position) on a bed. Place your left / right knee just beyond the edge of the surface so your knee is not on the bed. You can put a towel under your left / right thigh just above your kneecap for comfort. Relax your leg muscles and allow gravity to straighten your knee (extension). You should feel a stretch behind your left / right knee. Hold this position for __________ seconds. Scoot up so your knee is supported between repetitions. Repeat __________ times. Complete this exercise __________ times a day. Knee flexion, active  Lie on your back with both legs straight. If this causes back discomfort, bend your left / right knee so your foot is flat on the floor. Slowly slide your left / right heel back toward your buttocks. Stop when you feel a gentle stretch in the front of your knee or thigh (flexion). Hold this position for __________ seconds. Slowly slide your left / right heel back to the starting position. Repeat __________ times. Complete this exercise __________ times a day. Quadriceps stretch, prone  Lie on your abdomen on a firm surface, such as a bed or  padded floor. Bend your left / right knee and hold your ankle. If you cannot reach your ankle or pant leg, loop a belt around your foot and grab the belt instead. Gently pull your heel toward your buttocks. Your knee should not slide out to the side. You should feel a stretch in the front of your thigh and knee (quadriceps). Hold this position for __________ seconds. Repeat __________ times. Complete this exercise __________ times a day. Hamstring, supine  Lie on your back (supine position). Loop a belt or towel over the ball of your left / right foot. The ball of your foot is on the walking surface, right under your toes. Straighten your left / right knee and slowly pull on the belt to raise your leg until you feel a gentle stretch behind your knee (hamstring). Do not let your knee bend while you do this. Keep your other leg flat on the floor. Hold this position for __________ seconds. Repeat __________ times. Complete this exercise __________ times a day. Strengthening exercises These exercises build strength and endurance in your knee. Endurance is the ability to use your muscles for a long time, even after they get tired. Quadriceps, isometric This exercise strengthens the muscles in front of your thigh (quadriceps) without moving your knee joint (isometric). Lie on your back with your left / right leg extended and your other knee bent. Put a rolled towel or small pillow under your knee if told by your health care provider. Slowly tense the muscles in the front of your left /  right thigh. You should see your kneecap slide up toward your hip or see increased dimpling just above the knee. This motion will push the back of the knee toward the floor. For __________ seconds, hold the muscle as tight as you can without increasing your pain. Relax the muscles slowly and completely. Repeat __________ times. Complete this exercise __________ times a day. Straight leg raises This exercise  strengthens the muscles in front of your thigh (quadriceps) and the muscles that move your hips (hip flexors). Lie on your back with your left / right leg extended and your other knee bent. Tense the muscles in the front of your left / right thigh. You should see your kneecap slide up or see increased dimpling just above the knee. Your thigh may even shake a bit. Keep these muscles tight as you raise your leg 4-6 inches (10-15 cm) off the floor. Do not let your knee bend. Hold this position for __________ seconds. Keep these muscles tense as you lower your leg. Relax your muscles slowly and completely after each repetition. Repeat __________ times. Complete this exercise __________ times a day. Hamstring, isometric  Lie on your back on a firm surface. Bend your left / right knee about __________ degrees. Dig your left / right heel into the surface as if you are trying to pull it toward your buttocks. Tighten the muscles in the back of your thighs (hamstring) to dig as hard as you can without increasing any pain. Hold this position for __________ seconds. Release the tension gradually and allow your muscles to relax completely for __________ seconds after each repetition. Repeat __________ times. Complete this exercise __________ times a day. Hamstring curls If told by your health care provider, do this exercise while wearing ankle weights. Begin with __________lb / kg weights. Then increase the weight by 1 lb (0.5 kg) increments. Do not wear ankle weights that are more than __________lb / kg. Lie on your abdomen with your legs straight. Bend your left / right knee as far as you can without feeling pain. Keep your hips flat against the floor. Hold this position for __________ seconds. Slowly lower your leg to the starting position. Repeat __________ times. Complete this exercise __________ times a day. Squats This exercise strengthens the muscles in front of your thigh and knee  (quadriceps). Stand in front of a table, with your feet and knees pointing straight ahead. You may rest your hands on the table for balance but not for support. Slowly bend your knees and lower your hips like you are going to sit in a chair. Keep your weight over your heels, not over your toes. Keep your lower legs upright so they are parallel with the table legs. Do not let your hips go lower than your knees. Do not bend lower than told by your health care provider. If your knee pain increases, do not bend as low. Hold the squat position for __________ seconds. Slowly push with your legs to return to standing. Do not use your hands to pull yourself to standing. Repeat __________ times. Complete this exercise __________ times a day. Wall slides This exercise strengthens the muscles in front of your thigh and knee (quadriceps). Lean your back against a smooth wall or door, and walk your feet out 18-24 inches (46-61 cm) from it. Place your feet hip-width apart. Slowly slide down the wall or door until your knees bend __________ degrees. Keep your knees over your heels, not over your toes. Keep your knees  in line with your hips. Hold this position for __________ seconds. Repeat __________ times. Complete this exercise __________ times a day. Straight leg raises, side-lying This exercise strengthens the muscles that rotate the leg at the hip and move it away from your body (hip abductors). Lie on your side with your left / right leg in the top position. Lie so your head, shoulder, knee, and hip line up. You may bend your bottom knee to help you keep your balance. Roll your hips slightly forward so your hips are stacked directly over each other and your left / right knee is facing forward. Leading with your heel, lift your top leg 4-6 inches (10-15 cm). You should feel the muscles in your outer hip lifting. Do not let your foot drift forward. Do not let your knee roll toward the ceiling. Hold  this position for __________ seconds. Slowly return your leg to the starting position. Let your muscles relax completely after each repetition. Repeat __________ times. Complete this exercise __________ times a day. Straight leg raises, prone This exercise stretches the muscles that move your hips away from the front of the pelvis (hip extensors). Lie on your abdomen on a firm surface. You can put a pillow under your hips if that is more comfortable. Tense the muscles in your buttocks and lift your left / right leg about 4-6 inches (10-15 cm). Keep your knee straight as you lift your leg. Hold this position for __________ seconds. Slowly lower your leg to the starting position. Let your leg relax completely after each repetition. Repeat __________ times. Complete this exercise __________ times a day. This information is not intended to replace advice given to you by your health care provider. Make sure you discuss any questions you have with your health care provider. Document Revised: 11/07/2020 Document Reviewed: 11/07/2020 Elsevier Patient Education  2024 [employer].

## 2023-12-12 NOTE — Progress Notes (Signed)
 Acute Office Visit  Subjective:    Patient ID: Robin Townsend, female    DOB: September 09, 1975, 48 y.o.   MRN: [id number]  Chief Complaint  Patient presents with   Knee Pain    Left knee pain for 1 month    Knee Pain  Incident onset: onset 72month ago. There was no injury mechanism. The pain is present in the left knee. The quality of the pain is described as aching. The pain is severe. The pain has been Intermittent since onset. Pertinent negatives include no inability to bear weight, loss of motion, loss of sensation, muscle weakness, numbness or tingling. She reports no foreign bodies present. The symptoms are aggravated by weight bearing and movement (prolonged sitting). She has tried ice for the symptoms. The treatment provided mild relief.  Anterior knee pain x 72month, worse after prolonged immobility. No exercise regimen No previous injury.  Outpatient Medications Prior to Visit  Medication Sig   Adapalene  0.3 % gel APPLY PEA SIZE AMOUNT TOPICALLY TO FACE AT BEDTIME 2 NIGHTS PER WEEK   Azelaic Acid  15 % gel APPLY 1 APPLICATION TOPICALLY 2 (TWO) TIMES DAILY. AFTER SKIN IS THOROUGHLY WASHED AND PATTED DRY, GENTLY BUT THOROUGHLY MASSAGE A THIN FILM OF AZELAIC ACID  CREAM INTO THE AFFECTED AREA TWICE DAILY, IN THE MORNING AND EVENING. DO NOT USE ON THE NIGHTS YOU APPLY ADAPALENE .   metFORMIN (GLUCOPHAGE) 500 MG tablet Take 1 tablet (500 mg total) by mouth daily with breakfast.   Multiple Vitamin (MULTIVITAMIN) tablet Take 1 tablet by mouth daily.   Vitamin D , Ergocalciferol , (DRISDOL) 1.25 MG (50000 UNIT) CAPS capsule Take 1 capsule (50,000 Units total) by mouth every 7 (seven) days.   No facility-administered medications prior to visit.    Reviewed past medical and social history.  Review of Systems  Neurological:  Negative for tingling and numbness.   Per HPI     Objective:    Physical Exam Vitals and nursing note reviewed.  Musculoskeletal:     Right hip: Normal.     Left hip:  Normal.     Right upper leg: Normal.     Left upper leg: Normal.     Right knee: Crepitus present. No swelling, deformity, effusion or erythema. Normal range of motion. No tenderness.     Left knee: Crepitus present. No swelling, deformity, effusion, erythema, ecchymosis or lacerations. Normal range of motion. Tenderness present over the patellar tendon. No medial joint line, lateral joint line, MCL, LCL, ACL or PCL tenderness.     Right lower leg: Normal.     Left lower leg: Normal.  Neurological:     Mental Status: She is alert and oriented to person, place, and time.    BP 132/76 (BP Location: Left Arm, Patient Position: Sitting, Cuff Size: Large)   Pulse 84   Ht 5' 4 (1.626 m)   Wt 224 lb 3.2 oz (101.7 kg)   LMP 10/26/2018   SpO2 99%   BMI 38.48 kg/m    No results found for any visits on 12/12/23.     Assessment & Plan:   Problem List Items Addressed This Visit   None Visit Diagnoses       Left anterior knee pain    -  Primary   Relevant Medications   diclofenac  Sodium (VOLTAREN ) 1 % GEL      Meds ordered this encounter  Medications   diclofenac  Sodium (VOLTAREN ) 1 % GEL    Sig: Apply 2 g topically in the morning,  at noon, and at bedtime.    Supervising Provider:   BERNETA ELSIE SAYRE [[id number]]  Use voltaren  gel 2-3x/day Ok to alternate between warm and cold compress as needed Start knee exercises daily (2sets of 10repititions for each exercise) Call office if no improvement in 6weeks  Return if symptoms worsen or fail to improve.    Roselie Mood, NP

## 2023-12-15 ENCOUNTER — Ambulatory Visit: Admitting: Dermatology

## 2023-12-15 NOTE — Telephone Encounter (Signed)
 Please see if anyone has anything available sooner than 4 weeks, thanks!

## 2023-12-23 ENCOUNTER — Ambulatory Visit (INDEPENDENT_AMBULATORY_CARE_PROVIDER_SITE_OTHER): Admitting: Physician Assistant

## 2023-12-23 ENCOUNTER — Encounter (INDEPENDENT_AMBULATORY_CARE_PROVIDER_SITE_OTHER): Payer: Self-pay | Admitting: Physician Assistant

## 2023-12-23 VITALS — BP 121/82 | HR 73 | Temp 98.9°F | Ht 64.0 in | Wt 215.0 lb

## 2023-12-23 DIAGNOSIS — E559 Vitamin D deficiency, unspecified: Secondary | ICD-10-CM | POA: Diagnosis not present

## 2023-12-23 DIAGNOSIS — R7303 Prediabetes: Secondary | ICD-10-CM

## 2023-12-23 DIAGNOSIS — E669 Obesity, unspecified: Secondary | ICD-10-CM | POA: Diagnosis not present

## 2023-12-23 DIAGNOSIS — Z6837 Body mass index (BMI) 37.0-37.9, adult: Secondary | ICD-10-CM

## 2023-12-23 DIAGNOSIS — E7800 Pure hypercholesterolemia, unspecified: Secondary | ICD-10-CM | POA: Diagnosis not present

## 2023-12-23 NOTE — Progress Notes (Signed)
 SUBJECTIVE: Discussed the use of AI scribe software for clinical note transcription with the patient, who gave verbal consent to proceed.  Chief Complaint: Obesity  Interim History: She is down 4 lbs since her last visit.  Down 9 lbs overall TBW loss of 4.0%  Robin Townsend is here to discuss her progress with her obesity treatment plan. She is on the Category 2 Plan and states she is following her eating plan approximately 95 % of the time. She states she is not exercising .  Robin Townsend is a 48 year old female with obesity who presents for follow-up of her obesity treatment plan.  This is her third follow-up visit with the [facility] program. She adheres to her category two plan 95% of the time, tracking her calories and macros, and consuming more whole foods with adequate protein. She drinks enough water and does not skip meals. She sleeps seven to nine hours per night.  She is currently taking ergocalciferol  50,000 units once weekly for vitamin D  deficiency and metformin 500 mg daily for prediabetes. She initially experienced diarrhea with metformin, which has since resolved, but now has bowel movements every two days instead of daily or every other day. She uses diclofenac  gel for knee pain and takes a multivitamin daily.  Her diet includes a variety of fruits and vegetables, such as broccoli, apples, pears, mixed berries, frozen corn, and black beans. She incorporates more protein into her meals, including lunch meats and dinner portions of six to eight ounces of protein. She has not been exercising as part of her current plan but is focusing on adhering to her meal plan.  She works in a sedentary job and has been avoiding candy, opting for healthier snacks like 100-calorie fiber brownies.   Upcoming- She is preparing for a trip to [place]  to support her mother during a medical procedure and plans to maintain her dietary habits while traveling. OBJECTIVE: Visit  Diagnoses: Problem List Items Addressed This Visit     Vitamin D  deficiency   Pure hypercholesterolemia   Other Visit Diagnoses       Prediabetes    -  Primary     Obesity, starting BMI 38.45         BMI 37.0-37.9, adult Current BMI 37.0         Obesity Obesity management is ongoing with a focus on dietary changes and weight loss. She has lost nine pounds since starting the healthy weight and wellness program. She is adhering to a category two plan, tracking calories and macros, and consuming more whole foods and adequate protein. No exercise regimen has been initiated yet as the focus is on dietary adherence. - Continue category two dietary plan - Encourage tracking of calories and macros - Advise on protein intake and whole foods consumption - Discuss strategies for dining out,during her upcoming travel to [place]  to help her mom- such as protein loading before restaurant meals and focusing on healthy lean protein sources.  - Reinforce hydration and sleep hygiene while traveling as well.   Prediabetes Prediabetes is being managed with metformin 500 mg daily. She initially experienced diarrhea, which has resolved, but now reports less frequent bowel movements. Her A1c is 5.8%, indicating prediabetes but not severe hyperglycemia. She has been advised to consider halving the metformin dose if gastrointestinal symptoms persist. Lab Results  Component Value Date   HGBA1C 5.8 (H) 11/26/2023   HGBA1C 5.8 06/12/2016   Lab Results  Component Value Date  LDLCALC 132 (H) 11/26/2023   CREATININE 0.80 11/26/2023   INSULIN   Date Value Ref Range Status  11/26/2023 24.9 2.6 - 24.9 uIU/mL Final  ]Continue working on nutrition plan to decrease simple carbohydrates, increase lean proteins and exercise to promote weight loss, improve glycemic control and prevent progression to Type 2 diabetes.  - Continue metformin 500 mg daily- no refill needed this visit.  - Consider halving metformin dose if  gastrointestinal symptoms persist   Vitamin D  deficiency Vitamin D  deficiency is being treated with ergocalciferol  50,000 units once weekly. She reports no issues with this regimen. Her vitamin D  level was previously 28, which is below the desired range. No N/V or muscle weakness with Ergocalciferol  Low vitamin D  levels can be associated with adiposity and may result in leptin resistance and weight gain. Also associated with fatigue.  Currently on vitamin D  supplementation without any adverse effects such as nausea, vomiting or muscle weakness.   - Continue ergocalciferol  50,000 units once weekly - Plan to refill vitamin D  prescription at next appointment  Vitals Temp: 98.9 F (37.2 C) BP: 121/82 Pulse Rate: 73 SpO2: 100 %   Anthropometric Measurements Height: 5' 4 (1.626 m) Weight: 215 lb (97.5 kg) BMI (Calculated): 36.89 Weight at Last Visit: 219 lb Weight Lost Since Last Visit: 4 lb Weight Gained Since Last Visit: 0 Starting Weight: 224 lb Total Weight Loss (lbs): 9 lb (4.082 kg) Peak Weight: 224 lb   Body Composition  Body Fat %: 43.5 % Fat Mass (lbs): 93.8 lbs Muscle Mass (lbs): 115.6 lbs Total Body Water (lbs): 78.8 lbs Visceral Fat Rating : 12   Other Clinical Data Fasting: No Labs: No Today's Visit #: 3 Starting Date: 11/26/23     ASSESSMENT AND PLAN:  Diet: Aaradhya is currently in the action stage of change. As such, her goal is to continue with weight loss efforts. She has agreed to Category 2 Plan.  Exercise: Fronie has been instructed that some exercise is better than none for weight loss and overall health benefits.   Behavior Modification:  We discussed the following Behavioral Modification Strategies today: increasing lean protein intake, decreasing simple carbohydrates, increasing vegetables, increase H2O intake, increase high fiber foods, meal planning and cooking strategies, travel eating strategies, avoiding temptations, and planning for  success. We discussed various medication options to help Libra with her weight loss efforts and we both agreed to continue current treatment plan.  Return in about 2 weeks (around 01/06/2024).SABRA She was informed of the importance of frequent follow up visits to maximize her success with intensive lifestyle modifications for her multiple health conditions.  Attestation Statements:   Reviewed by clinician on day of visit: allergies, medications, problem list, medical history, surgical history, family history, social history, and previous encounter notes.   Time spent on visit including pre-visit chart review and post-visit care and charting was 30 minutes.    Mialee Weyman, PA-C

## 2024-01-01 ENCOUNTER — Other Ambulatory Visit

## 2024-01-06 ENCOUNTER — Ambulatory Visit (INDEPENDENT_AMBULATORY_CARE_PROVIDER_SITE_OTHER): Admitting: Family Medicine

## 2024-01-06 ENCOUNTER — Encounter (INDEPENDENT_AMBULATORY_CARE_PROVIDER_SITE_OTHER): Payer: Self-pay | Admitting: Family Medicine

## 2024-01-06 VITALS — BP 118/84 | HR 76 | Temp 97.8°F | Ht 64.0 in | Wt 211.0 lb

## 2024-01-06 DIAGNOSIS — E559 Vitamin D deficiency, unspecified: Secondary | ICD-10-CM | POA: Diagnosis not present

## 2024-01-06 DIAGNOSIS — E669 Obesity, unspecified: Secondary | ICD-10-CM | POA: Diagnosis not present

## 2024-01-06 DIAGNOSIS — Z6836 Body mass index (BMI) 36.0-36.9, adult: Secondary | ICD-10-CM | POA: Diagnosis not present

## 2024-01-06 DIAGNOSIS — R7303 Prediabetes: Secondary | ICD-10-CM | POA: Diagnosis not present

## 2024-01-06 MED ORDER — VITAMIN D (ERGOCALCIFEROL) 1.25 MG (50000 UNIT) PO CAPS: 50000.0000 [IU] | ORAL_CAPSULE | ORAL | 0 refills | Status: DC

## 2024-01-06 MED ORDER — METFORMIN HCL 500 MG PO TABS: 500.0000 mg | ORAL_TABLET | Freq: Every day | ORAL | 0 refills | Status: DC

## 2024-01-06 NOTE — Progress Notes (Signed)
 Office: [phone]  /  Fax: [phone]  WEIGHT SUMMARY AND BIOMETRICS  Anthropometric Measurements Height: 5' 4 (1.626 m) Weight: 211 lb (95.7 kg) BMI (Calculated): 36.2 Weight at Last Visit: 215 lb Weight Lost Since Last Visit: 4 lb Weight Gained Since Last Visit: 0 Starting Weight: 224 lb Total Weight Loss (lbs): 13 lb (5.897 kg) Peak Weight: 224 lb Waist Measurement : 44 inches   Body Composition  Body Fat %: 42.7 % Fat Mass (lbs): 90.2 lbs Muscle Mass (lbs): 115 lbs Total Body Water (lbs): 77.6 lbs Visceral Fat Rating : 11   Other Clinical Data Fasting: no Labs: no Today's Visit #: 4 Starting Date: 11/26/23    Chief Complaint: OBESITY     History of Present Illness Robin Townsend is a 48 year old female with obesity who presents for a follow-up on her weight management plan.  She is adhering to a category two eating plan 80% of the time, focusing on increasing her intake of fruits, vegetables, and protein, while ensuring adequate hydration and not skipping meals. She has lost four pounds in the last two weeks.  During a recent trip to [place]  for her mother's birthday, she indulged in foods like catfish and barbecue but practiced portion control, avoiding bread and returning to her eating plan post-trip.  She experienced diarrhea last week, managed with ginger ale and chicken noodle soup broth, and is unsure of the cause but feels better now.  Her hunger is reasonably controlled, and she is not experiencing any issues with her current medications, including metformin and vitamin D . She has run out of vitamin D  and plans to refill it soon.  She is not currently exercising but is considering incorporating it into her routine. She uses a device to track her steps and standing time, aiming for nine hours of standing at work and occasionally walking to increase activity.  She is cautious about sugar intake, particularly in protein bars and smoothies, and  prefers to avoid high-sugar options. She is exploring different food options and is mindful of her dietary choices, especially with upcoming holidays.  She reports no gastrointestinal upset with metformin use.      PHYSICAL EXAM:  Blood pressure 118/84, pulse 76, temperature 97.8 F (36.6 C), height 5' 4 (1.626 m), weight 211 lb (95.7 kg), last menstrual period 10/26/2018, SpO2 100%. Body mass index is 36.22 kg/m.  DIAGNOSTIC DATA REVIEWED:  BMET    Component Value Date/Time   NA 139 11/26/2023 0833   K 4.5 11/26/2023 0833   CL 104 11/26/2023 0833   CO2 19 (L) 11/26/2023 0833   GLUCOSE 86 11/26/2023 0833   GLUCOSE 90 04/04/2023 1130   BUN 11 11/26/2023 0833   CREATININE 0.80 11/26/2023 0833   CALCIUM 9.6 11/26/2023 0833   Lab Results  Component Value Date   HGBA1C 5.8 (H) 11/26/2023   HGBA1C 5.8 06/12/2016   Lab Results  Component Value Date   INSULIN  24.9 11/26/2023   Lab Results  Component Value Date   TSH 1.140 11/26/2023   CBC    Component Value Date/Time   WBC 5.0 11/26/2023 0833   WBC 5.4 04/04/2023 1130   RBC 4.95 11/26/2023 0833   RBC 5.04 04/04/2023 1130   HGB 13.5 11/26/2023 0833   HCT 41.9 11/26/2023 0833   PLT 256 11/26/2023 0833   MCV 85 11/26/2023 0833   MCH 27.3 11/26/2023 0833   MCH 22.8 (L) 11/18/2018 0454   MCHC 32.2 11/26/2023 0833   MCHC  32.4 04/04/2023 1130   RDW 13.8 11/26/2023 0833   Iron Studies    Component Value Date/Time   IRON 65 06/12/2016 0947   TIBC 358 06/12/2016 0947   FERRITIN 7.4 (L) 06/12/2016 0947   IRONPCTSAT 18 06/12/2016 0947   Lipid Panel     Component Value Date/Time   CHOL 206 (H) 11/26/2023 0833   TRIG 61 11/26/2023 0833   HDL 63 11/26/2023 0833   CHOLHDL 3 05/31/2021 0842   VLDL 15.0 05/31/2021 0842   LDLCALC 132 (H) 11/26/2023 0833   Hepatic Function Panel     Component Value Date/Time   PROT 7.3 11/26/2023 0833   ALBUMIN 4.4 11/26/2023 0833   AST 49 (H) 11/26/2023 0833   ALT 56 (H)  11/26/2023 0833   ALKPHOS 95 11/26/2023 0833   BILITOT 0.3 11/26/2023 0833      Component Value Date/Time   TSH 1.140 11/26/2023 0833   Nutritional Lab Results  Component Value Date   VD25OH 28.1 (L) 11/26/2023     Assessment and Plan Assessment & Plan Obesity Management is ongoing with a focus on dietary modifications. She adheres to the category two eating plan 80% of the time, mindful of calorie intake, and is increasing fruits, vegetables, and protein. She has lost four pounds in the last two weeks. Challenges include occasional dietary lapses, such as during a recent trip to [place] , but she resumes her plan promptly. She is not currently exercising but is encouraged to incorporate physical activity into her routine. Emphasis is placed on sustainable exercise habits rather than intense gym workouts, focusing on long-term adherence and personal preference for solo activities. - Continue category two eating plan. - Encouraged increased physical activity, focusing on sustainable habits. - Scheduled follow-up appointments to monitor progress.  Prediabetes Managed with metformin 500 mg daily. She is working on dietary modifications to control glucose levels, including reducing simple carbohydrates and increasing protein intake. No gastrointestinal upset reported with metformin. She is actively engaged in weight loss efforts to reduce the risk of developing type 2 diabetes. - Refilled metformin 500 mg daily with food. - Continue dietary modifications to control glucose levels. - Will recheck labs in two months to assess progress.  Vitamin D  deficiency Managed with prescription vitamin D  50,000 IU weekly. She is not yet at goal for vitamin D  levels. A refill is requested. - Refilled vitamin D  50,000 IU weekly. - Will recheck labs in two months to assess progress.    Robin Townsend was counseled on the importance of maintaining healthy lifestyle habits, including balanced nutrition, regular  physical activity, and behavioral modifications, while taking antiobesity medication.  Patient verbalized understanding that medication is an adjunct to, not a replacement for, lifestyle changes and that the long-term success and weight maintenance depend on continued adherence to these strategies.   Robin Townsend was informed of the importance of frequent follow up visits to maximize her success with intensive lifestyle modifications for her obesity and obesity related health conditions as recommended by USPSTF and CMS guidelines   Louann Penton, MD

## 2024-01-12 ENCOUNTER — Encounter: Admitting: Nurse Practitioner

## 2024-01-20 ENCOUNTER — Encounter (INDEPENDENT_AMBULATORY_CARE_PROVIDER_SITE_OTHER): Payer: Self-pay | Admitting: Family Medicine

## 2024-01-20 ENCOUNTER — Ambulatory Visit (INDEPENDENT_AMBULATORY_CARE_PROVIDER_SITE_OTHER): Payer: Self-pay | Admitting: Family Medicine

## 2024-01-20 VITALS — BP 106/78 | HR 91 | Temp 97.9°F | Ht 64.0 in | Wt 209.0 lb

## 2024-01-20 DIAGNOSIS — Z6835 Body mass index (BMI) 35.0-35.9, adult: Secondary | ICD-10-CM

## 2024-01-20 DIAGNOSIS — E559 Vitamin D deficiency, unspecified: Secondary | ICD-10-CM

## 2024-01-20 DIAGNOSIS — E669 Obesity, unspecified: Secondary | ICD-10-CM | POA: Diagnosis not present

## 2024-01-20 DIAGNOSIS — R7303 Prediabetes: Secondary | ICD-10-CM | POA: Diagnosis not present

## 2024-01-20 DIAGNOSIS — K760 Fatty (change of) liver, not elsewhere classified: Secondary | ICD-10-CM | POA: Diagnosis not present

## 2024-01-20 NOTE — Progress Notes (Signed)
 Office: [phone]  /  Fax: [phone]  WEIGHT SUMMARY AND BIOMETRICS  Anthropometric Measurements Height: 5' 4 (1.626 m) Weight: 209 lb (94.8 kg) BMI (Calculated): 35.86 Weight at Last Visit: 211 lb Weight Lost Since Last Visit: 2 lb Weight Gained Since Last Visit: 0 Starting Weight: 224 lb Total Weight Loss (lbs): 15 lb (6.804 kg) Peak Weight: 224 lb Waist Measurement : 44 inches   Body Composition  Body Fat %: 42.3 % Fat Mass (lbs): 88.4 lbs Muscle Mass (lbs): 114.6 lbs Total Body Water (lbs): 76.8 lbs Visceral Fat Rating : 11   Other Clinical Data Fasting: no Labs: no Today's Visit #: 5 Starting Date: 11/26/23    Chief Complaint: OBESITY   History of Present Illness Robin Townsend is a 48 year old female with obesity and prediabetes who presents for a follow-up on her obesity treatment plan and progress.  She adheres to the category two eating plan 90% of the time, focusing on increasing her intake of fruits, vegetables, and the recommended amount of protein. She is adequately hydrating and not skipping meals. She generally sleeps seven to eight hours per night and exercises by walking for twenty minutes three days a week. She has lost two pounds in the last two weeks.  Her prediabetes is managed with metformin 500 mg per day taken at breakfast, along with dietary and exercise modifications. No issues with her medication regimen and she ensures she takes her medication even if she has a late breakfast.  She is treated for vitamin D  deficiency with prescription ergocalciferol , 50,000 IU per week. She has no difficulty remembering the weekly dose of vitamin D .  She has a history of metabolic associated steatohepatitis and elevated liver enzymes, for which she has had ultrasounds in the past.      PHYSICAL EXAM:  Blood pressure 106/78, pulse 91, temperature 97.9 F (36.6 C), height 5' 4 (1.626 m), weight 209 lb (94.8 kg), last menstrual period 10/26/2018,  SpO2 99%. Body mass index is 35.87 kg/m.  DIAGNOSTIC DATA REVIEWED:  BMET    Component Value Date/Time   NA 139 11/26/2023 0833   K 4.5 11/26/2023 0833   CL 104 11/26/2023 0833   CO2 19 (L) 11/26/2023 0833   GLUCOSE 86 11/26/2023 0833   GLUCOSE 90 04/04/2023 1130   BUN 11 11/26/2023 0833   CREATININE 0.80 11/26/2023 0833   CALCIUM 9.6 11/26/2023 0833   Lab Results  Component Value Date   HGBA1C 5.8 (H) 11/26/2023   HGBA1C 5.8 06/12/2016   Lab Results  Component Value Date   INSULIN  24.9 11/26/2023   Lab Results  Component Value Date   TSH 1.140 11/26/2023   CBC    Component Value Date/Time   WBC 5.0 11/26/2023 0833   WBC 5.4 04/04/2023 1130   RBC 4.95 11/26/2023 0833   RBC 5.04 04/04/2023 1130   HGB 13.5 11/26/2023 0833   HCT 41.9 11/26/2023 0833   PLT 256 11/26/2023 0833   MCV 85 11/26/2023 0833   MCH 27.3 11/26/2023 0833   MCH 22.8 (L) 11/18/2018 0454   MCHC 32.2 11/26/2023 0833   MCHC 32.4 04/04/2023 1130   RDW 13.8 11/26/2023 0833   Iron Studies    Component Value Date/Time   IRON 65 06/12/2016 0947   TIBC 358 06/12/2016 0947   FERRITIN 7.4 (L) 06/12/2016 0947   IRONPCTSAT 18 06/12/2016 0947   Lipid Panel     Component Value Date/Time   CHOL 206 (H) 11/26/2023 9166  TRIG 61 11/26/2023 0833   HDL 63 11/26/2023 0833   CHOLHDL 3 05/31/2021 0842   VLDL 15.0 05/31/2021 0842   LDLCALC 132 (H) 11/26/2023 0833   Hepatic Function Panel     Component Value Date/Time   PROT 7.3 11/26/2023 0833   ALBUMIN 4.4 11/26/2023 0833   AST 49 (H) 11/26/2023 0833   ALT 56 (H) 11/26/2023 0833   ALKPHOS 95 11/26/2023 0833   BILITOT 0.3 11/26/2023 0833      Component Value Date/Time   TSH 1.140 11/26/2023 0833   Nutritional Lab Results  Component Value Date   VD25OH 28.1 (L) 11/26/2023     Assessment and Plan Assessment & Plan Obesity with BMI 35.0-35.9, adult Obesity management is ongoing with a focus on lifestyle modifications. She adheres to  the category two eating plan 90% of the time, increases fruit and vegetable intake, and maintains adequate hydration. She exercises by walking 20 minutes three times a week and has lost two pounds in the last two weeks. She plans to maintain moderation during Thanksgiving to prevent weight regain. - Continue category two eating plan - Encouraged increased intake of fruits and vegetables - Continue walking 20 minutes three times a week - Discussed Thanksgiving eating strategies to maintain weight loss  Prediabetes managed with metformin and lifestyle modification Prediabetes is managed with metformin 500 mg daily at breakfast and lifestyle modifications. She reports no issues with medication adherence and plans to maintain moderation during Thanksgiving to prevent weight regain. - Continue metformin 500 mg daily at breakfast - Continue lifestyle modifications including diet and exercise  Vitamin D  deficiency treated with ergocalciferol  Vitamin D  deficiency is managed with ergocalciferol  50,000 IU weekly. She reports no issues with medication adherence and finds it easy to remember the weekly dose. - Continue ergocalciferol  50,000 IU weekly  Metabolic associated steatohepatitis (fatty liver disease) Metabolic associated steatohepatitis is managed with weight loss. She has elevated liver enzymes and previous ultrasounds. Weight loss is the primary treatment strategy. An ultrasound is pending to assess current liver status. - Ordered ultrasound to assess fatty liver status, please call and schedule - Continue weight loss efforts  Robin Townsend was counseled on the importance of maintaining healthy lifestyle habits, including balanced nutrition, regular physical activity, and behavioral modifications, while taking antiobesity medication.  Patient verbalized understanding that medication is an adjunct to, not a replacement for, lifestyle changes and that the long-term success and weight maintenance depend on  continued adherence to these strategies.   Robin Townsend was informed of the importance of frequent follow up visits to maximize her success with intensive lifestyle modifications for her obesity and obesity related health conditions as recommended by USPSTF and CMS guidelines   Louann Penton, MD

## 2024-02-16 NOTE — Progress Notes (Unsigned)
 SUBJECTIVE: Discussed the use of AI scribe software for clinical note transcription with the patient, who gave verbal consent to proceed.  Chief Complaint: Obesity  Interim History: She is down 1 lb since her last visit.  Down 16 lbs overall TBW loss of 7.1%  Robin Townsend is here to discuss her progress with her obesity treatment plan. She is on the Category 2 Plan and states she is following her eating plan approximately 90 % of the time. She states she is exercising walking 30 minutes 5 times per week. Robin Townsend is a 48 year old female who presents for follow-up of her obesity treatment plan.  She adheres to a category two nutrition plan 90% of the time, resulting in a total weight loss of 16 pounds, with a 1-pound reduction since her last visit. Her diet includes more whole foods, she meets her protein requirements, and maintains adequate hydration. She ensures not to skip meals but struggles with consistently achieving at least seven hours of sleep per night. Her physical activity includes walking for 30 minutes five days a week.  She is currently on metformin  500 mg daily for prediabetes and ergocalciferol  50,000 units once weekly for vitamin D  deficiency, along with a daily Townsend. She experiences no issues with metformin , although occasionally takes it later in the day if she misses breakfast.  Over the past two to three weeks, she has experienced a recurrence of hot flashes and night sweats, which she attributes to decreased water intake. She has not visited her gynecologist this year but intends to follow up due to these symptoms.  No issues with her current medications.  She is considering reintroducing a liquid Townsend, Robin Townsend, into her regimen, which she previously found beneficial. She is cautious about her fruit intake, opting for low-sugar options like berries, apples, and pears, and occasionally consuming melons and  peaches.  OBJECTIVE: Visit Diagnoses: Problem List Items Addressed This Visit     Vitamin D  deficiency   Other Visit Diagnoses       Prediabetes    -  Primary     Perimenopausal vasomotor symptoms         Obesity, starting BMI 38.45         BMI 35.0-35.9,adult Current BMI 35.8          Obesity She is following a category two nutrition plan 90% of the time, resulting in a total weight loss of 16 pounds. She is eating more whole foods, getting adequate protein, and drinking enough water. She is not skipping meals and is sleeping at least seven hours per night. She walks for 30 minutes five days a week. Her body adipose is 42.4%, with a goal to reduce it to 35% or less. She aims to reach a weight of 180 pounds. Emphasis is placed on body composition and energy levels rather than just weight. - Continue current nutrition plan and exercise regimen. - Focus on increasing muscle mass to aid in weight maintenance. - Encouraged hydration to help with weight management and reduce night sweats.  Prediabetes She is on metformin  500 mg daily and reports no issues with the medication. She takes it later in the day if she forgets breakfast, which is acceptable. Weight loss and exercise are beneficial for managing prediabetes. Lab Results  Component Value Date   HGBA1C 5.8 (H) 11/26/2023   HGBA1C 5.8 06/12/2016   Lab Results  Component Value Date   LDLCALC 132 (H) 11/26/2023   CREATININE 0.80 11/26/2023  INSULIN   Date Value Ref Range Status  11/26/2023 24.9 2.6 - 24.9 uIU/mL Final  ]Continue working on nutrition plan to decrease simple carbohydrates, increase lean proteins and exercise to promote weight loss, improve glycemic control and prevent progression to Type 2 diabetes.  - Continue metformin  500 mg daily. No refill needed this visit.  - Encouraged continued weight loss and exercise.  Vitamin D  deficiency She is on ergocalciferol  50,000 units once weekly and takes a Townsend  daily. She inquired about Robin Townsend, which is deemed acceptable but contains a high amount of vitamin A, and we may need to check vitamin A levels when checking follow up labs in future.  - Continue ergocalciferol  50,000 units once weekly. - Consider checking vitamin A levels if Robin Townsend is taken regularly.  Perimenopausal symptoms She reports returning hot flashes and night sweats, likely due to increased perimenopausal symptoms. She is advised on non-hormonal management strategies and the potential benefits of weight loss and exercise. She is encouraged to consult her GYN for further management options, including non-hormonal treatments vs. HRT. - Consider non-hormonal treatments for perimenopausal symptoms. - Encouraged weight loss and exercise to potentially reduce symptoms. - Advised consulting GYN for further management options. Vitals Temp: 98.2 F (36.8 C) BP: (!) 145/85 Pulse Rate: 91 SpO2: 96 %   Anthropometric Measurements Height: 5' 4 (1.626 m) Weight: 208 lb (94.3 kg) BMI (Calculated): 35.69 Weight at Last Visit: 209 lb Weight Lost Since Last Visit: 1 lb Weight Gained Since Last Visit: 0 Starting Weight: 224 lb Total Weight Loss (lbs): 16 lb (7.258 kg) Peak Weight: 224 lb   Body Composition  Body Fat %: 42.4 % Fat Mass (lbs): 88.4 lbs Muscle Mass (lbs): 114.2 lbs Total Body Water (lbs): 78.2 lbs Visceral Fat Rating : 11   Other Clinical Data Fasting: No Labs: No Today's Visit #: 6 Starting Date: 11/26/23     ASSESSMENT AND PLAN:  Diet: Robin Townsend is currently in the action stage of change. As such, her goal is to continue with weight loss efforts. She has agreed to Category 2 Plan.  Exercise: Robin Townsend has been instructed to work up to a goal of 150 minutes of combined cardio and strengthening exercise per week for weight loss and overall health benefits.   Behavior Modification:  We discussed the following  Behavioral Modification Strategies today: increasing lean protein intake, decreasing simple carbohydrates, increasing vegetables, increase H2O intake, increase high fiber foods, meal planning and cooking strategies, holiday eating strategies, avoiding temptations, and planning for success. We discussed various medication options to help Robin Townsend with her weight loss efforts and we both agreed to continue metformin  for primary indication of prediabetes.  Return in about 4 weeks (around 03/16/2024).Robin Townsend She was informed of the importance of frequent follow up visits to maximize her success with intensive lifestyle modifications for her multiple health conditions.  Attestation Statements:   Reviewed by clinician on day of visit: allergies, medications, problem list, medical history, surgical history, family history, social history, and previous encounter notes.   Time spent on visit including pre-visit chart review and post-visit care and charting was 31 minutes.    Robin Zavadil, PA-C

## 2024-02-17 ENCOUNTER — Ambulatory Visit (INDEPENDENT_AMBULATORY_CARE_PROVIDER_SITE_OTHER): Payer: Self-pay | Admitting: Physician Assistant

## 2024-02-17 ENCOUNTER — Encounter (INDEPENDENT_AMBULATORY_CARE_PROVIDER_SITE_OTHER): Payer: Self-pay | Admitting: Physician Assistant

## 2024-02-17 VITALS — BP 145/85 | HR 91 | Temp 98.2°F | Ht 64.0 in | Wt 208.0 lb

## 2024-02-17 DIAGNOSIS — E559 Vitamin D deficiency, unspecified: Secondary | ICD-10-CM | POA: Diagnosis not present

## 2024-02-17 DIAGNOSIS — N951 Menopausal and female climacteric states: Secondary | ICD-10-CM | POA: Diagnosis not present

## 2024-02-17 DIAGNOSIS — R7303 Prediabetes: Secondary | ICD-10-CM

## 2024-02-17 DIAGNOSIS — Z6835 Body mass index (BMI) 35.0-35.9, adult: Secondary | ICD-10-CM

## 2024-02-17 DIAGNOSIS — E669 Obesity, unspecified: Secondary | ICD-10-CM | POA: Diagnosis not present

## 2024-02-17 DIAGNOSIS — E7800 Pure hypercholesterolemia, unspecified: Secondary | ICD-10-CM

## 2024-02-23 ENCOUNTER — Ambulatory Visit
Admission: RE | Admit: 2024-02-23 | Discharge: 2024-02-23 | Disposition: A | Source: Ambulatory Visit | Attending: Family Medicine | Admitting: Family Medicine

## 2024-02-23 DIAGNOSIS — K760 Fatty (change of) liver, not elsewhere classified: Secondary | ICD-10-CM | POA: Diagnosis not present

## 2024-03-17 ENCOUNTER — Ambulatory Visit (INDEPENDENT_AMBULATORY_CARE_PROVIDER_SITE_OTHER): Payer: Self-pay | Admitting: Family Medicine

## 2024-03-17 ENCOUNTER — Encounter (INDEPENDENT_AMBULATORY_CARE_PROVIDER_SITE_OTHER): Payer: Self-pay

## 2024-03-27 ENCOUNTER — Other Ambulatory Visit (INDEPENDENT_AMBULATORY_CARE_PROVIDER_SITE_OTHER): Payer: Self-pay | Admitting: Family Medicine

## 2024-04-04 ENCOUNTER — Other Ambulatory Visit (INDEPENDENT_AMBULATORY_CARE_PROVIDER_SITE_OTHER): Payer: Self-pay | Admitting: Family Medicine

## 2024-04-13 ENCOUNTER — Ambulatory Visit: Admitting: Dermatology

## 2024-04-14 ENCOUNTER — Ambulatory Visit (INDEPENDENT_AMBULATORY_CARE_PROVIDER_SITE_OTHER): Admitting: Physician Assistant

## 2024-04-14 ENCOUNTER — Encounter (INDEPENDENT_AMBULATORY_CARE_PROVIDER_SITE_OTHER): Payer: Self-pay | Admitting: Physician Assistant

## 2024-04-14 VITALS — BP 135/91 | HR 83 | Temp 98.1°F | Ht 64.0 in | Wt 206.0 lb

## 2024-04-14 DIAGNOSIS — R7303 Prediabetes: Secondary | ICD-10-CM

## 2024-04-14 DIAGNOSIS — R030 Elevated blood-pressure reading, without diagnosis of hypertension: Secondary | ICD-10-CM

## 2024-04-14 DIAGNOSIS — E7800 Pure hypercholesterolemia, unspecified: Secondary | ICD-10-CM

## 2024-04-14 DIAGNOSIS — N951 Menopausal and female climacteric states: Secondary | ICD-10-CM

## 2024-04-14 DIAGNOSIS — G479 Sleep disorder, unspecified: Secondary | ICD-10-CM

## 2024-04-14 DIAGNOSIS — K760 Fatty (change of) liver, not elsewhere classified: Secondary | ICD-10-CM

## 2024-04-14 DIAGNOSIS — Z6835 Body mass index (BMI) 35.0-35.9, adult: Secondary | ICD-10-CM | POA: Diagnosis not present

## 2024-04-14 DIAGNOSIS — E669 Obesity, unspecified: Secondary | ICD-10-CM

## 2024-04-14 DIAGNOSIS — R61 Generalized hyperhidrosis: Secondary | ICD-10-CM

## 2024-04-14 DIAGNOSIS — E559 Vitamin D deficiency, unspecified: Secondary | ICD-10-CM | POA: Diagnosis not present

## 2024-04-14 MED ORDER — VITAMIN D (ERGOCALCIFEROL) 1.25 MG (50000 UNIT) PO CAPS: 50000.0000 [IU] | ORAL_CAPSULE | ORAL | 0 refills | Status: AC

## 2024-04-14 MED ORDER — METFORMIN HCL 500 MG PO TABS: 500.0000 mg | ORAL_TABLET | Freq: Two times a day (BID) | ORAL | 0 refills | Status: AC

## 2024-05-17 ENCOUNTER — Ambulatory Visit (INDEPENDENT_AMBULATORY_CARE_PROVIDER_SITE_OTHER): Admitting: Physician Assistant

## 2024-06-21 ENCOUNTER — Ambulatory Visit (INDEPENDENT_AMBULATORY_CARE_PROVIDER_SITE_OTHER): Admitting: Family Medicine
